# Patient Record
Sex: Male | Born: 1960 | Hispanic: No | Marital: Married | Smoking: Former smoker
Health system: Southern US, Community
[De-identification: ages and names within clinical notes are randomized; demographics above are authoritative.]

## PROBLEM LIST (undated history)

## (undated) DIAGNOSIS — I10 Essential (primary) hypertension: Secondary | ICD-10-CM

## (undated) DIAGNOSIS — I639 Cerebral infarction, unspecified: Secondary | ICD-10-CM

## (undated) DIAGNOSIS — G510 Bell's palsy: Secondary | ICD-10-CM

## (undated) DIAGNOSIS — E785 Hyperlipidemia, unspecified: Secondary | ICD-10-CM

## (undated) HISTORY — DX: Cerebral infarction, unspecified: I63.9

## (undated) HISTORY — DX: Essential (primary) hypertension: I10

## (undated) HISTORY — DX: Bell's palsy: G51.0

## (undated) HISTORY — DX: Hyperlipidemia, unspecified: E78.5

---

## 2006-04-23 ENCOUNTER — Ambulatory Visit: Payer: Self-pay | Admitting: Otolaryngology

## 2008-12-01 ENCOUNTER — Ambulatory Visit: Payer: Self-pay | Admitting: Internal Medicine

## 2013-01-07 ENCOUNTER — Ambulatory Visit: Payer: Self-pay | Admitting: Family Medicine

## 2016-06-27 ENCOUNTER — Other Ambulatory Visit: Payer: Self-pay | Admitting: Family Medicine

## 2016-06-27 ENCOUNTER — Ambulatory Visit
Admission: RE | Admit: 2016-06-27 | Discharge: 2016-06-27 | Disposition: A | Discharge status: Home / Self Care | Payer: BLUE CROSS/BLUE SHIELD | Source: Ambulatory Visit | Attending: Family Medicine | Admitting: Family Medicine

## 2016-06-27 DIAGNOSIS — I700 Atherosclerosis of aorta: Secondary | ICD-10-CM | POA: Diagnosis not present

## 2016-06-27 DIAGNOSIS — R0602 Shortness of breath: Secondary | ICD-10-CM | POA: Insufficient documentation

## 2016-06-27 DIAGNOSIS — R918 Other nonspecific abnormal finding of lung field: Secondary | ICD-10-CM | POA: Diagnosis not present

## 2016-06-27 DIAGNOSIS — R062 Wheezing: Secondary | ICD-10-CM | POA: Diagnosis present

## 2016-06-27 DIAGNOSIS — I708 Atherosclerosis of other arteries: Secondary | ICD-10-CM | POA: Insufficient documentation

## 2016-06-27 DIAGNOSIS — M545 Low back pain: Secondary | ICD-10-CM | POA: Insufficient documentation

## 2016-06-27 DIAGNOSIS — M5136 Other intervertebral disc degeneration, lumbar region: Secondary | ICD-10-CM | POA: Insufficient documentation

## 2016-06-27 DIAGNOSIS — M4856XA Collapsed vertebra, not elsewhere classified, lumbar region, initial encounter for fracture: Secondary | ICD-10-CM | POA: Insufficient documentation

## 2016-09-30 DIAGNOSIS — I639 Cerebral infarction, unspecified: Secondary | ICD-10-CM

## 2016-09-30 HISTORY — DX: Cerebral infarction, unspecified: I63.9

## 2017-07-20 IMAGING — CR DG LUMBAR SPINE COMPLETE 4+V
1 series · 5 of 5 positions shown · non-contrast
Comparison: 01/07/2013

CLINICAL DATA: Pain.

EXAM:
LUMBAR SPINE - COMPLETE 4+ VIEW

[Series 1: dg lumbar spine complete 4 +v · 0.14mm/px · 5 of 5 slices shown]
[im 1/5]
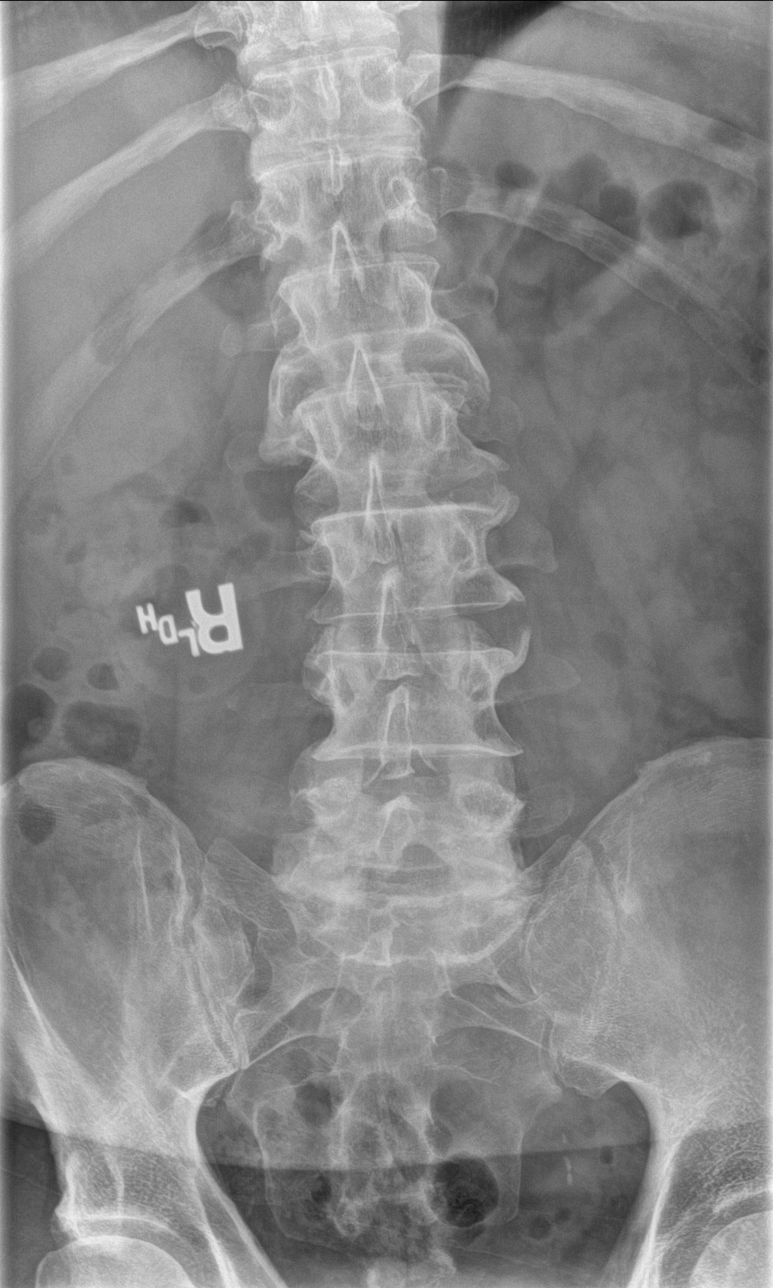
[im 2/5]
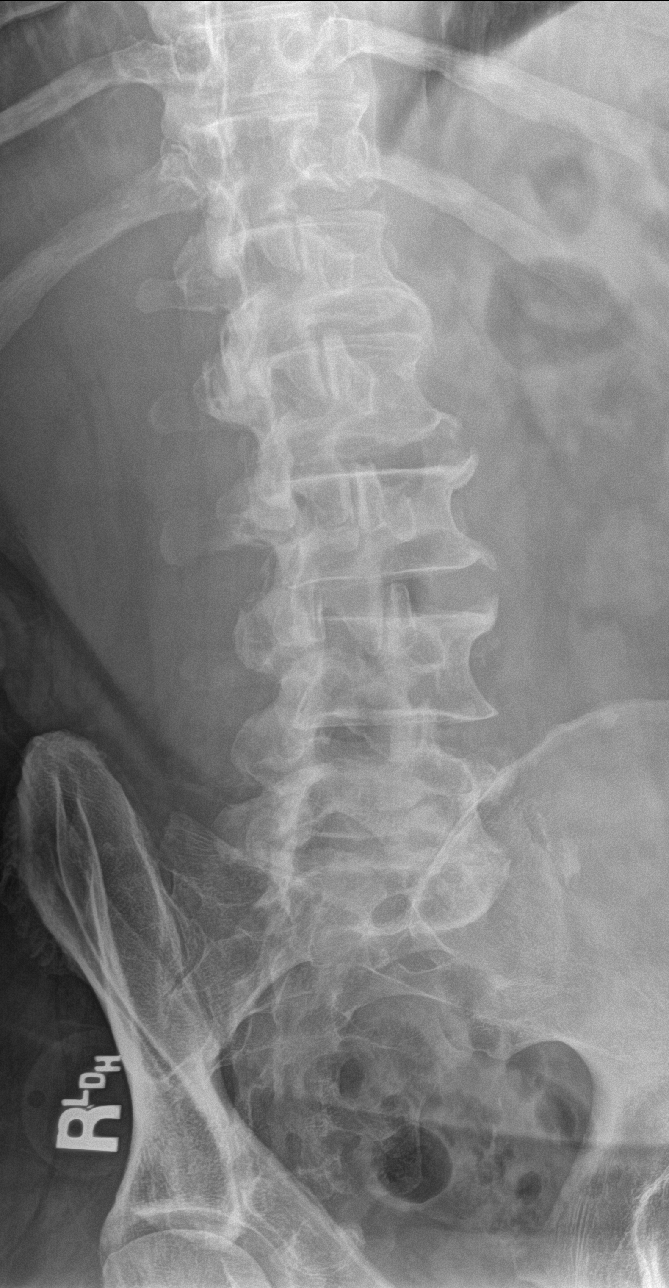
[im 3/5]
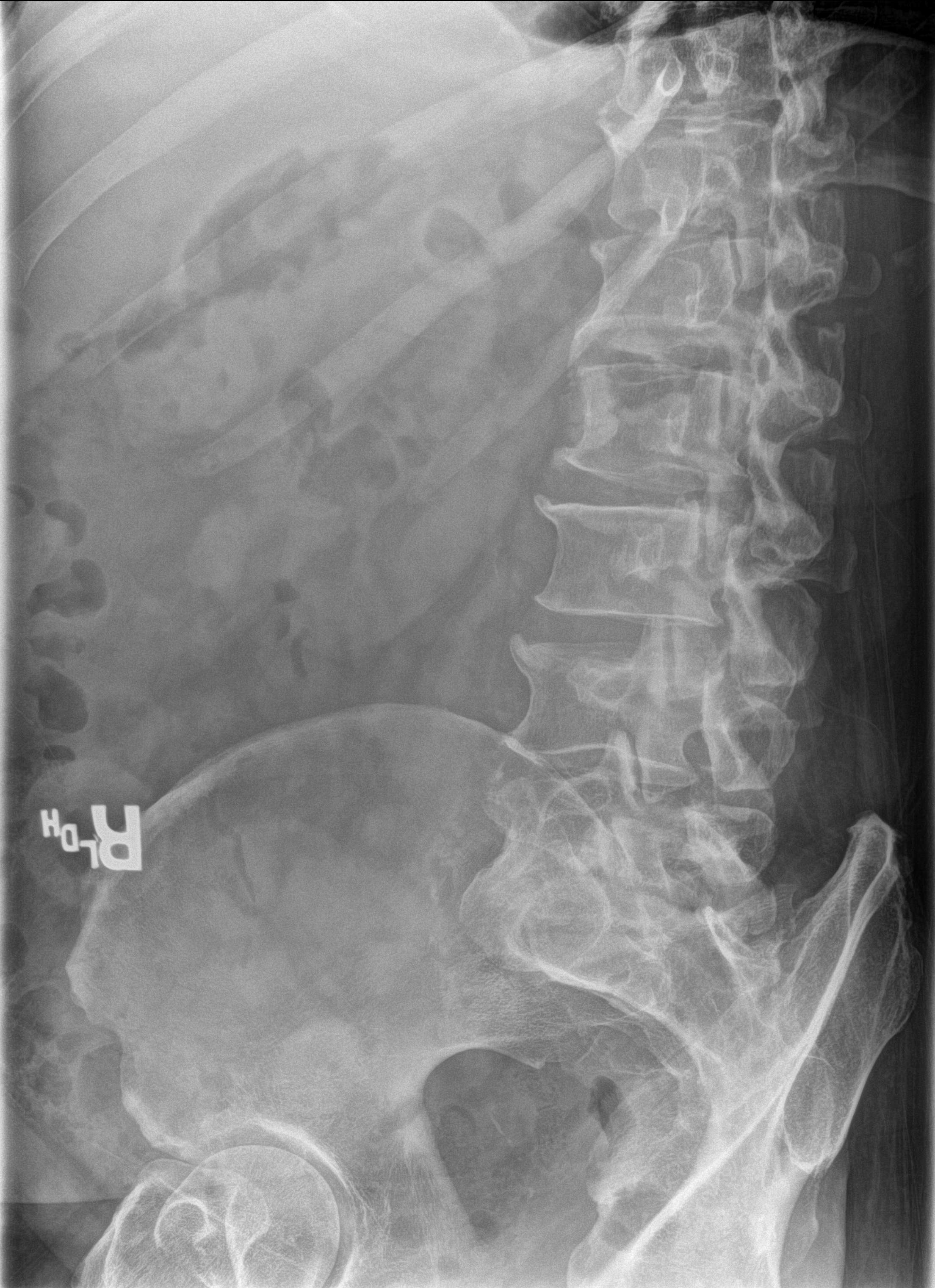
[im 4/5]
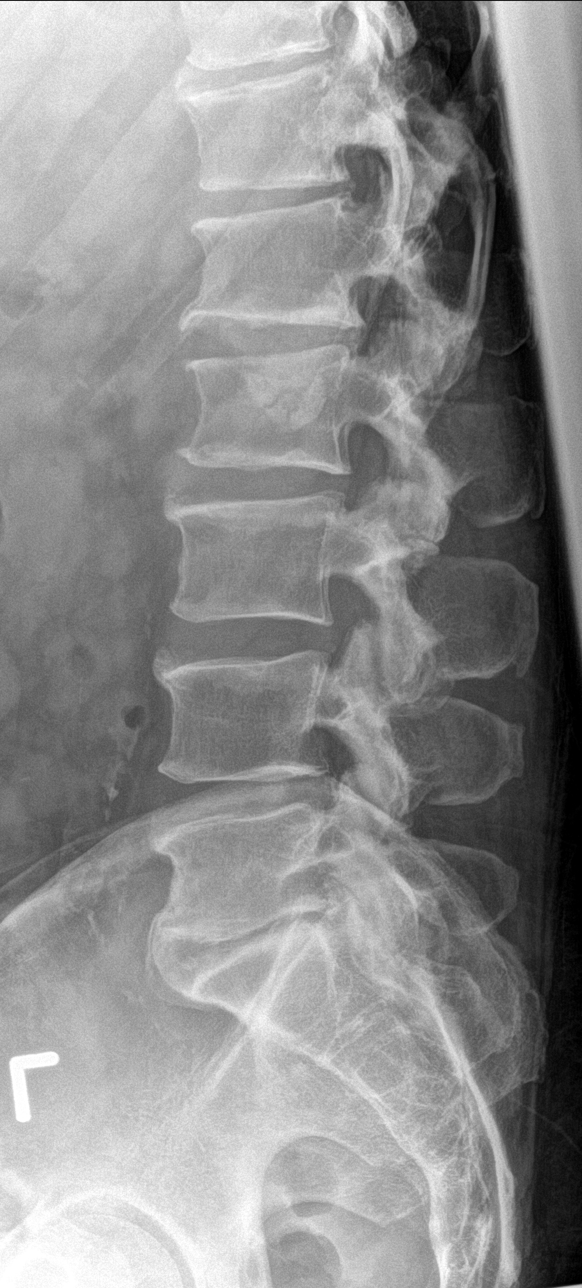
[im 5/5]
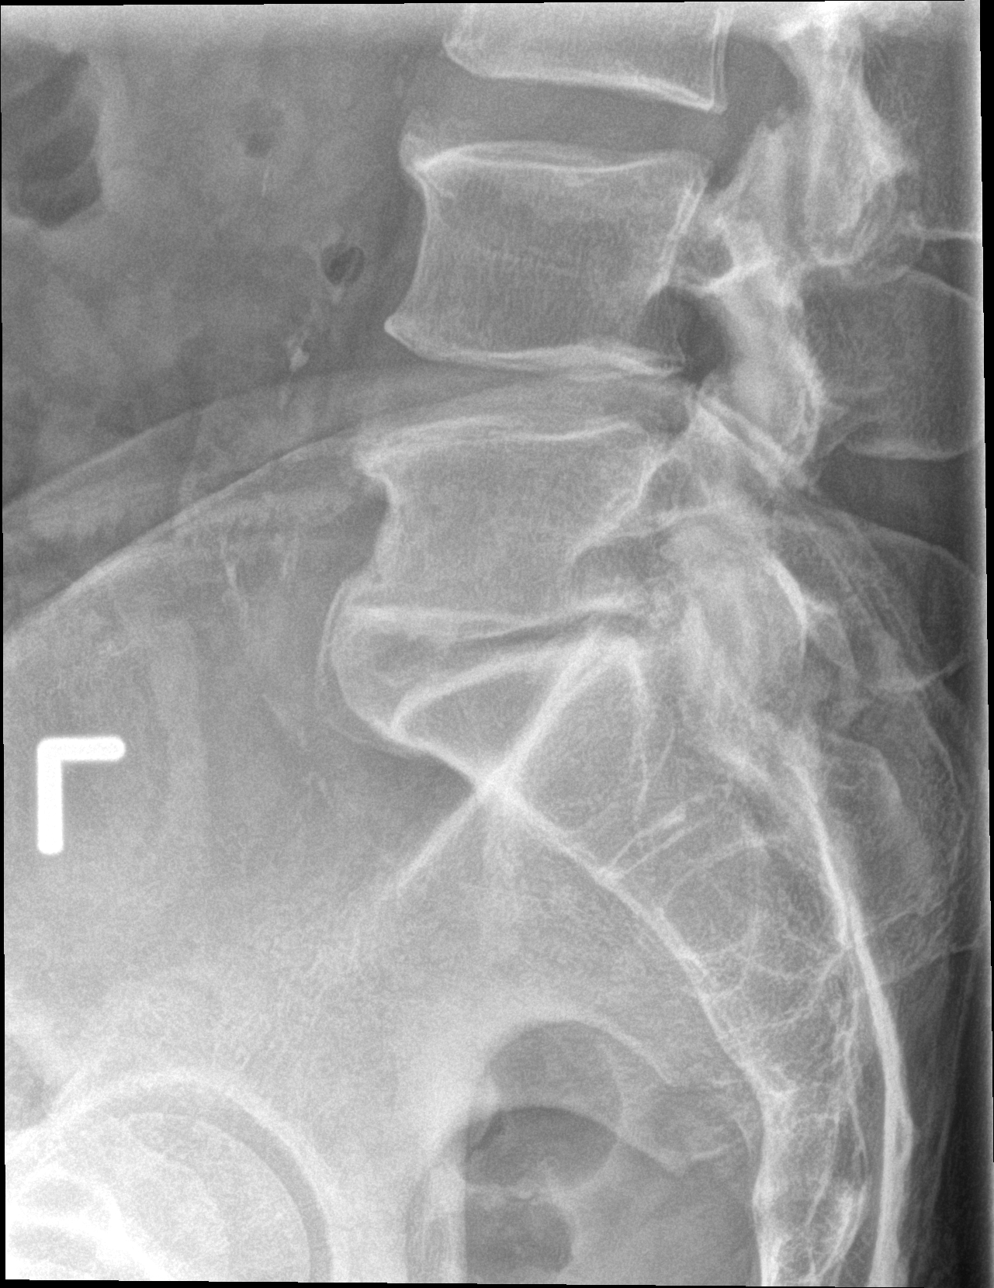

[5 of 5 positions shown; findings below may reference images not displayed]

FINDINGS: Diffuse multilevel degenerative changes lumbar spinal scoliosis
concave right. Stable mild L1 compression fracture. Aortoiliac
atherosclerotic vascular calcification.
IMPRESSION: 1. Diffuse multilevel degenerative change.

2. Stable L1 compression fracture.

3. Aortoiliac atherosclerotic vascular disease .

## 2017-09-24 DIAGNOSIS — I639 Cerebral infarction, unspecified: Secondary | ICD-10-CM | POA: Insufficient documentation

## 2017-09-24 DIAGNOSIS — F17200 Nicotine dependence, unspecified, uncomplicated: Secondary | ICD-10-CM | POA: Insufficient documentation

## 2017-10-06 ENCOUNTER — Encounter: Payer: Self-pay | Admitting: Physical Therapy

## 2017-10-06 ENCOUNTER — Ambulatory Visit: Payer: BLUE CROSS/BLUE SHIELD | Attending: Family Medicine | Admitting: Occupational Therapy

## 2017-10-06 ENCOUNTER — Ambulatory Visit: Payer: BLUE CROSS/BLUE SHIELD | Admitting: Physical Therapy

## 2017-10-06 ENCOUNTER — Encounter: Payer: Self-pay | Admitting: Occupational Therapy

## 2017-10-06 DIAGNOSIS — M6281 Muscle weakness (generalized): Secondary | ICD-10-CM | POA: Diagnosis present

## 2017-10-06 DIAGNOSIS — R278 Other lack of coordination: Secondary | ICD-10-CM | POA: Insufficient documentation

## 2017-10-06 NOTE — Therapy (Signed)
[place] [facility] [address] [place], [place], [address] Phone: [phone]   Fax:  [phone]  Physical Therapy Evaluation  Patient Details  Name: Thomas RathkeMichael E Brandt MRN: [id number] Date of Birth: September 19, 1961 Referring Provider: Luvenia HellerWANG, Booker   Encounter Date: 10/06/2017  PT End of Session - 10/06/17 1434    Visit Number  1    PT Start Time  0150    PT Stop Time  0220    PT Time Calculation (min)  30 min    Equipment Utilized During Treatment  Gait belt    Activity Tolerance  Patient tolerated treatment well    Behavior During Therapy  [facility] for tasks assessed/performed       History reviewed. No pertinent past medical history.  History reviewed. No pertinent surgical history.  There were no vitals filed for this visit.   Subjective Assessment - 10/06/17 1352    Subjective  Patient says that his left side is weaker than his right side but he also says that he has been this way for his whole life.     Pertinent History  Patient was dizzy and was driving home . He was dizzy for 10 hours and he [facility] with slurred speech. He was at the hospital for 2 days. He remembers having a PT very briefly and then he was sent home the next day.     How long can you stand comfortably?  WNL    How long can you walk comfortably?  normal distances    Patient Stated Goals  Not need therapy for outpatient.     Currently in Pain?  No/denies    Multiple Pain Sites  No         [facility] PT Assessment - 10/06/17 1356      Assessment   Medical Diagnosis  cva    Referring Provider  Luvenia HellerWANG, Williams    Onset Date/Surgical Date  09/23/17    Hand Dominance  Right    Next MD Visit  10/17/17    Prior Therapy  hospital      Precautions   Precautions  None      Restrictions   Weight Bearing Restrictions  No      Balance Screen   Has the patient fallen in the past 6 months  No    Has the patient had a decrease in activity level  because of a fear of falling?   No    Is the patient reluctant to leave their home because of a fear of falling?   No      Home Public house managernvironment   Living Environment  Private residence    Living Arrangements  Spouse/significant other    Available Help at Discharge  Family    Type of Home  House    Home Access  Level entry    Home Layout  One level    Home Equipment  None      Prior Function   Level of Independence  Independent    Vocation  Full time employment    Education administratorVocation Requirements  truck mechanic    Leisure  horses      Cognition   Overall Cognitive Status  Within Functional Limits for tasks assessed    Attention  Focused       PAIN: No pain  POSTURE: WNL   PROM/AROM: WNL BUE and BLE   STRENGTH:  Graded on a 0-5 scale Muscle Group Left  Right                          Hip Flex 5/5 5/5  Hip Abd 5/5 5/5  Hip Add 5/5 5/5  Hip Ext 4/5 4/5  Hip IR/ER    Knee Flex 5/5 5/5  Knee Ext 5/5 5/5  Ankle DF 5/5 5/5  Ankle PF 5/5 5/5   SENSATION: WNL all ext's  FUNCTIONAL MOBILITY: independent transfers and bed mobility   BALANCE: Normal dynamic and static standing and independent with tandem stand and single leg stand bilaterally   GAIT: WNL no impairments; acending/descending steps without railings independently, gait speed WNL            Objective measurements completed on examination: See above findings.              PT Education - 10/06/17 1359    Education provided  Yes    Education Details  plan  of care    Person(s) Educated  Patient    Methods  Explanation    Comprehension  Verbalized understanding                  Plan - 10/06/17 1435    Clinical Impression Statement  Patient is 57 yr old male who presents s/p CVA. Patient was in the [facility] hospital for 2 days and discharged home. He has no impairments with strength, ROM, gait or mobility. His balance is WNL sitting and standing. He was not recommended for skilled PT at this  time and no further PT recommended at this time.     Clinical Presentation  Stable    Clinical Decision Making  Low    PT Frequency  One time visit    PT Home Exercise Plan  recommended cardiac work out each day such as walking or gym TM or machine    Consulted and Agree with Plan of Care  Patient       Patient will benefit from skilled therapeutic intervention in order to improve the following deficits and impairments:     Visit Diagnosis: Muscle weakness (generalized)     Problem List There are no active problems to display for this patient.   [address] [place] DPT 10/06/2017, 2:39 PM  [facility] MAIN [facility] SERVICES [address] [place], [place], [address] Phone: [phone]   Fax:  [phone]  Name: Thomas Brandt MRN: [id number] Date of Birth: [birth year]

## 2017-10-07 ENCOUNTER — Encounter: Payer: Self-pay | Admitting: Occupational Therapy

## 2017-10-07 NOTE — Therapy (Signed)
[place] [facility] MAIN [facility] SERVICES [address] [place], [place], [address] Phone: [phone]   Fax:  [phone]  Occupational Therapy Evaluation  Patient Details  Name: Thomas Brandt MRN: [id number] Date of Birth: Jul 22, 1961 No Data Recorded  Encounter Date: 10/06/2017  OT End of Session - 10/07/17 1526    Visit Number  1    Number of Visits  1    OT Start Time  1305    OT Stop Time  1342    OT Time Calculation (min)  37 min    Activity Tolerance  Patient tolerated treatment well    Behavior During Therapy  [facility] for tasks assessed/performed       History reviewed. No pertinent past medical history.  History reviewed. No pertinent surgical history.  There were no vitals filed for this visit.  Subjective Assessment - 10/06/17 1312    Subjective   Patient reports his doctor at [facility] said he could return to work however MD at his work wanted him to get evaluated first by therapy. Patient reports he is doing well and has had no difficulty with his daily tasks, independent with self care and IADL tasks including driving.     Pertinent History  Patient reports he had a CVA on Christmas day, 09/23/17 and was hospitalized at [facility] for 2 days.  Initial symptoms he was dizzy and had left sided weakness which he reports resolved.     Patient Stated Goals  Patient reports he wants to be independent in all tasks, return to work.     Currently in Pain?  No/denies    Multiple Pain Sites  No        [facility] OT Assessment - 10/07/17 1533      Assessment   Medical Diagnosis  CVA    Referring Provider  Luvenia Heller    Onset Date/Surgical Date  09/23/17    Hand Dominance  Right    Next MD Visit  10/17/2017    Prior Therapy  hospital      Precautions   Precautions  None      Restrictions   Weight Bearing Restrictions  No      Balance Screen   Has the patient fallen in the past 6 months  No      Home  Environment   Family/patient expects  to be discharged to:  Private residence    Living Arrangements  Spouse/significant other    Available Help at Discharge  Family    Type of Home  House    Home Access  Stairs    Home Layout  One level    Bathroom Shower/Tub  Tub/Shower unit;Curtain    Games developer  None    Lives With  Spouse      Prior Function   Level of Independence  Independent    Vocation  Full time employment    Education administrator    Leisure  horses      ADL   Eating/Feeding  Independent    Grooming  Independent    Upper Body Bathing  Independent    Lower Body Bathing  Independent    Financial trader - Air traffic controller  Independent      IADL   Shopping  Takes care of all shopping needs independently    Light Housekeeping  Does personal laundry completely    Meal Prep  Plans, prepares and serves adequate meals independently    Prior Level of Function Medication Managment  independent    Prior Level of Function Financial Management  independent      Mobility   Mobility Status  Independent      Written Expression   Dominant Hand  Right      Vision - History   Baseline Vision  No visual deficits      Cognition   Overall Cognitive Status  Within Functional Limits for tasks assessed      Sensation   Light Touch  Appears Intact    Stereognosis  Appears Intact    Hot/Cold  Appears Intact    Proprioception  Appears Intact      Coordination   Gross Motor Movements are Fluid and Coordinated  Yes    Fine Motor Movements are Fluid and Coordinated  Yes    Coordination and Movement Description  14 sec difference in 9 hole peg test but functional no no issues with finger to nose or rapid alternating movements.     9 Hole Peg Test  Right;Left    Right 9 Hole Peg Test  24    Left 9 Hole Peg Test  38      ROM / Strength    AROM / PROM / Strength  AROM;Strength      AROM   Overall AROM   Within functional limits for tasks performed      Strength   Overall Strength  Within functional limits for tasks performed    Overall Strength Comments  5/5 BUE      Hand Function   Right Hand Grip (lbs)  95    Right Hand Lateral Pinch  29 lbs    Right Hand 3 Point Pinch  24 lbs    Left Hand Grip (lbs)  90    Left Hand Lateral Pinch  24 lbs    Left 3 point pinch  17 lbs                      OT Education - 10/07/17 1525    Education provided  Yes    Education Details  OT role, plan of care, coordination exercises for LUE.     Person(s) Educated  Patient    Methods  Explanation;Demonstration;Handout    Comprehension  Returned demonstration;Verbalized understanding;Need further instruction          OT Long Term Goals - 10/07/17 1531      OT LONG TERM GOAL #1   Title  Patient will demonstrate understanding of coordination exercises for left hand for increased speed and dexterity.      Baseline  able to demo understanding at eval    Time  1    Period  Days    Status  Achieved            Plan - 10/07/17 1526    Clinical Impression Statement  Patient is a 57 yo male who suffered a CVA on 09/23/2017 and [facility]  He was discharged home and reports his doctor said he could return to work without any restrictions.  He was referred for further OT/PT evaluation for return to work.  Patient was evaluated by OT this date and was able to complete all tasks independently including self care,  IADL tasks and reports he is able to drive. His strength was 5/5 in BUE, grip strength was Right 95#, left 90#.  He had good pinch strengths and coordination was fair to good.  Issued written home program to further work on left coordination skills for speed and dexterity however patient was functional with all tasks, 14 sec difference in right hand left hand speed with 9 hole  peg test. Patient denies any pain.  He has no current OT needs at this time and no further OT is recommended.     OT Frequency  One time visit    OT Treatment/Interventions  Neuromuscular education    Clinical Decision Making  Limited treatment options, no task modification necessary    Consulted and Agree with Plan of Care  Patient       Patient will benefit from skilled therapeutic intervention in order to improve the following deficits and impairments:     Visit Diagnosis: Other lack of coordination    Problem List There are no active problems to display for this patient.  Kerrie Buffalo, OTR/L, CLT  Lovett,Amy 10/07/2017, 3:37 PM  [place] [facility] MAIN [facility] SERVICES [address] [place], [place], [address] Phone: [phone]   Fax:  [phone]  Name: Thomas Brandt MRN: [id number] Date of Birth: 11-Nov-1960

## 2017-10-13 ENCOUNTER — Ambulatory Visit: Payer: BLUE CROSS/BLUE SHIELD | Admitting: Physical Therapy

## 2017-10-13 ENCOUNTER — Encounter: Payer: BLUE CROSS/BLUE SHIELD | Admitting: Occupational Therapy

## 2017-10-15 ENCOUNTER — Encounter: Payer: BLUE CROSS/BLUE SHIELD | Admitting: Occupational Therapy

## 2017-10-15 ENCOUNTER — Ambulatory Visit: Payer: BLUE CROSS/BLUE SHIELD | Admitting: Physical Therapy

## 2017-10-21 ENCOUNTER — Encounter: Payer: BLUE CROSS/BLUE SHIELD | Admitting: Occupational Therapy

## 2017-10-21 ENCOUNTER — Ambulatory Visit: Payer: BLUE CROSS/BLUE SHIELD | Admitting: Physical Therapy

## 2017-10-23 ENCOUNTER — Encounter: Payer: BLUE CROSS/BLUE SHIELD | Admitting: Occupational Therapy

## 2017-10-23 ENCOUNTER — Ambulatory Visit: Payer: BLUE CROSS/BLUE SHIELD | Admitting: Physical Therapy

## 2017-10-27 ENCOUNTER — Encounter: Payer: BLUE CROSS/BLUE SHIELD | Admitting: Occupational Therapy

## 2017-10-27 ENCOUNTER — Ambulatory Visit: Payer: BLUE CROSS/BLUE SHIELD | Admitting: Physical Therapy

## 2017-10-29 ENCOUNTER — Encounter: Payer: BLUE CROSS/BLUE SHIELD | Admitting: Occupational Therapy

## 2017-10-29 ENCOUNTER — Ambulatory Visit: Payer: BLUE CROSS/BLUE SHIELD | Admitting: Physical Therapy

## 2018-11-02 ENCOUNTER — Encounter: Payer: Self-pay | Admitting: Family Medicine

## 2018-11-02 ENCOUNTER — Ambulatory Visit (INDEPENDENT_AMBULATORY_CARE_PROVIDER_SITE_OTHER): Payer: 59 | Admitting: Family Medicine

## 2018-11-02 VITALS — BP 126/68 | HR 89 | Temp 98.4°F | Ht 72.0 in | Wt 222.1 lb

## 2018-11-02 DIAGNOSIS — E119 Type 2 diabetes mellitus without complications: Secondary | ICD-10-CM

## 2018-11-02 DIAGNOSIS — E782 Mixed hyperlipidemia: Secondary | ICD-10-CM

## 2018-11-02 DIAGNOSIS — Z8673 Personal history of transient ischemic attack (TIA), and cerebral infarction without residual deficits: Secondary | ICD-10-CM

## 2018-11-02 DIAGNOSIS — J441 Chronic obstructive pulmonary disease with (acute) exacerbation: Secondary | ICD-10-CM

## 2018-11-02 DIAGNOSIS — I10 Essential (primary) hypertension: Secondary | ICD-10-CM | POA: Diagnosis not present

## 2018-11-02 DIAGNOSIS — Z1211 Encounter for screening for malignant neoplasm of colon: Secondary | ICD-10-CM

## 2018-11-02 MED ORDER — METHYLPREDNISOLONE 4 MG PO TBPK: ORAL_TABLET | ORAL | 0 refills | Status: DC

## 2018-11-02 MED ORDER — AMLODIPINE BESY-BENAZEPRIL HCL 5-20 MG PO CAPS: 1.0000 | ORAL_CAPSULE | Freq: Every day | ORAL | 3 refills | Status: DC

## 2018-11-02 MED ORDER — AZITHROMYCIN 250 MG PO TABS: ORAL_TABLET | ORAL | 0 refills | Status: DC

## 2018-11-02 NOTE — Progress Notes (Signed)
Subjective:    Patient ID: Thomas Brandt, male    DOB: 05-07-1961, 58 y.o.   MRN: [id number]  HPI   Patient presents to clinic to establish with PCP.  Patient has type 2 diabetes, does not check his blood sugar every day.  Currently takes metformin XR 500 mg.  Patient has COPD.  Breathing is controlled with Incruse and Symbicort.  Also takes Singulair and Zyrtec during allergy seasons to help reduce any exacerbations.  Currently patient is having chest congestion, cough and wheezing.  States sometimes he will cough up thick tan phlegm.  Patient has a history of high blood pressure and elevated cholesterol.  BP well controlled on current medications.  Tolerating atorvastatin without any issues, denies any muscle aches or abdominal pains.  Patient does have a history of CVA in 2018.  Patient was followed by neurology and cardiology for a year, but was told he is stable and did not need to follow with them regularly anymore.  Patient Active Problem List   Diagnosis Date Noted  . Essential hypertension 11/03/2018  . Type 2 diabetes mellitus without complication, without long-term current use of insulin (HCC) 11/03/2018  . Chronic obstructive pulmonary disease (HCC) 11/03/2018  . Mixed hyperlipidemia 11/03/2018   Past Medical History:  Diagnosis Date  . Hyperlipidemia   . Hypertension   . Stroke [facility])    Social History   Tobacco Use  . Smoking status: Former Smoker    Types: Cigarettes  . Smokeless tobacco: Never Used  Substance Use Topics  . Alcohol use: Not on file   Family History  Problem Relation Age of Onset  . Heart disease Father    History reviewed. No pertinent surgical history.  Review of Systems  Constitutional: Negative for chills, fatigue and fever.  HENT: Negative for congestion, ear pain, sinus pain and sore throat.   Eyes: Negative.   Respiratory: cough, chest congestion, shortness of breath and wheezing.   Cardiovascular: Negative for chest pain,  palpitations and leg swelling.  Gastrointestinal: Negative for abdominal pain, diarrhea, nausea and vomiting.  Genitourinary: Negative for dysuria, frequency and urgency.  Musculoskeletal: Negative for arthralgias and myalgias.  Skin: Negative for color change, pallor and rash.  Neurological: Negative for syncope, light-headedness and headaches.  Psychiatric/Behavioral: The patient is not nervous/anxious.       Objective:   Physical Exam Vitals signs and nursing note reviewed.  Constitutional:      General: He is not in acute distress.    Appearance: He is not toxic-appearing.  HENT:     Head: Normocephalic and atraumatic.     Right Ear: Tympanic membrane and ear canal normal.     Left Ear: Tympanic membrane and ear canal normal.     Nose: Nose normal.     Mouth/Throat:     Mouth: Mucous membranes are moist.  Eyes:     Pupils: Pupils are equal, round, and reactive to light.  Neck:     Musculoskeletal: Neck supple. No neck rigidity.     Vascular: No carotid bruit.  Cardiovascular:     Rate and Rhythm: Normal rate and regular rhythm.     Heart sounds: Normal heart sounds.  Pulmonary:     Effort: Pulmonary effort is normal. No respiratory distress.     Breath sounds: Wheezing and rhonchi present. No rales.     Comments: Wheezes and throughout. Scattered rhonchi.  Abdominal:     Palpations: Abdomen is soft.  Musculoskeletal:     Right  lower leg: No edema.     Left lower leg: No edema.  Lymphadenopathy:     Cervical: No cervical adenopathy.  Skin:    General: Skin is warm and dry.     Coloration: Skin is not pale.  Neurological:     Mental Status: He is alert and oriented to person, place, and time.     Gait: Gait normal.  Psychiatric:        Mood and Affect: Mood normal.        Behavior: Behavior normal.    Vitals:   11/02/18 1116  BP: 126/68  Pulse: 89  Temp: 98.4 F (36.9 C)  SpO2: 95%      Assessment & Plan:   Essential hypertension-blood pressure well  controlled on current medications.  Refill of amlodipine benazepril given.  Mixed hyperlipidemia-patient tolerating current atorvastatin dose without any issues.  We will continue this medication.  History of CVA- patient will continue Plavix for blood thinning purposes as recommended by cardiology and neurology.  COPD with exacerbation-patient will continue his Incruse and Symbicort as he currently takes.  We will treat with course of steroid, Z-Pak to treat exacerbation.  Colon cancer screening-patient would prefer not to have a colonoscopy, but is agreeable to Cologuard.  Cologuard order placed.  Lab work drawn in clinic today.  Patient will follow-up in approximately 3 months for complete physical exam.

## 2018-11-03 ENCOUNTER — Other Ambulatory Visit: Payer: Self-pay | Admitting: Family Medicine

## 2018-11-03 ENCOUNTER — Encounter: Payer: Self-pay | Admitting: Family Medicine

## 2018-11-03 DIAGNOSIS — J449 Chronic obstructive pulmonary disease, unspecified: Secondary | ICD-10-CM | POA: Insufficient documentation

## 2018-11-03 DIAGNOSIS — Z8673 Personal history of transient ischemic attack (TIA), and cerebral infarction without residual deficits: Secondary | ICD-10-CM | POA: Insufficient documentation

## 2018-11-03 DIAGNOSIS — E782 Mixed hyperlipidemia: Secondary | ICD-10-CM | POA: Insufficient documentation

## 2018-11-03 DIAGNOSIS — I10 Essential (primary) hypertension: Secondary | ICD-10-CM | POA: Insufficient documentation

## 2018-11-03 DIAGNOSIS — E119 Type 2 diabetes mellitus without complications: Secondary | ICD-10-CM | POA: Insufficient documentation

## 2018-11-03 NOTE — Telephone Encounter (Signed)
Copied from CRM [id number]. Topic: Quick Communication - Rx Refill/Question >> Nov 03, 2018 11:54 AM Percival Spanish wrote: Medication   amLODipine-benazepril (LOTREL) 5-20 MG capsule     rx was sent 11/02/2018 and phamracy told pt they did not received the req   Has the patient contacted their pharmacy yes   (Agent: If no, request that the patient contact the pharmacy for the refill.) (Agent: If yes, when and what did the pharmacy advise?)  Preferred Pharmacy  [facility]   Agent: Please be advised that RX refills may take up to 3 business days. We ask that you follow-up with your pharmacy.

## 2018-11-16 ENCOUNTER — Other Ambulatory Visit (INDEPENDENT_AMBULATORY_CARE_PROVIDER_SITE_OTHER): Payer: 59

## 2018-11-16 DIAGNOSIS — E119 Type 2 diabetes mellitus without complications: Secondary | ICD-10-CM | POA: Diagnosis not present

## 2018-11-16 DIAGNOSIS — E538 Deficiency of other specified B group vitamins: Secondary | ICD-10-CM

## 2018-11-16 DIAGNOSIS — I10 Essential (primary) hypertension: Secondary | ICD-10-CM

## 2018-11-16 LAB — CBC
HCT: 44 % (ref 39.0–52.0)
Hemoglobin: 14.9 g/dL (ref 13.0–17.0)
MCHC: 33.9 g/dL (ref 30.0–36.0)
MCV: 89.9 fl (ref 78.0–100.0)
Platelets: 286 10*3/uL (ref 150.0–400.0)
RBC: 4.9 Mil/uL (ref 4.22–5.81)
RDW: 14.1 % (ref 11.5–15.5)
WBC: 10.8 10*3/uL — ABNORMAL HIGH (ref 4.0–10.5)

## 2018-11-16 LAB — COMPREHENSIVE METABOLIC PANEL
ALT: 32 U/L (ref 0–53)
AST: 18 U/L (ref 0–37)
Albumin: 4.1 g/dL (ref 3.5–5.2)
Alkaline Phosphatase: 86 U/L (ref 39–117)
BUN: 11 mg/dL (ref 6–23)
CO2: 30 mEq/L (ref 19–32)
Calcium: 9.2 mg/dL (ref 8.4–10.5)
Chloride: 101 mEq/L (ref 96–112)
Creatinine, Ser: 0.7 mg/dL (ref 0.40–1.50)
GFR: 116.16 mL/min (ref >60.00)
Glucose, Bld: 133 mg/dL — ABNORMAL HIGH (ref 70–99)
Potassium: 4.5 mEq/L (ref 3.5–5.1)
Sodium: 140 mEq/L (ref 135–145)
Total Bilirubin: 0.8 mg/dL (ref 0.2–1.2)
Total Protein: 7.1 g/dL (ref 6.0–8.3)

## 2018-11-16 LAB — LIPID PANEL
Cholesterol: 126 mg/dL (ref 0–200)
HDL: 46.8 mg/dL (ref >39.00)
LDL Cholesterol: 48 mg/dL (ref 0–99)
NonHDL: 78.9
Total CHOL/HDL Ratio: 3
Triglycerides: 154 mg/dL — ABNORMAL HIGH (ref 0.0–149.0)
VLDL: 30.8 mg/dL (ref 0.0–40.0)

## 2018-11-16 LAB — HEMOGLOBIN A1C: Hgb A1c MFr Bld: 7.2 % — ABNORMAL HIGH (ref 4.6–6.5)

## 2018-11-17 LAB — THYROID PANEL WITH TSH
Free Thyroxine Index: 2.1 (ref 1.4–3.8)
T3 Uptake: 29 % (ref 22–35)
T4, Total: 7.1 ug/dL (ref 4.9–10.5)
TSH: 1.68 mIU/L (ref 0.40–4.50)

## 2018-11-17 LAB — VITAMIN D 25 HYDROXY (VIT D DEFICIENCY, FRACTURES): VITD: 38.18 ng/mL (ref 30.00–100.00)

## 2018-11-17 LAB — B12 AND FOLATE PANEL
Folate: 12.8 ng/mL (ref >5.9)
Vitamin B-12: 163 pg/mL — ABNORMAL LOW (ref 211–911)

## 2018-11-18 MED ORDER — METFORMIN HCL ER (MOD) 1000 MG PO TB24: 1000.0000 mg | ORAL_TABLET | Freq: Every day | ORAL | 1 refills | Status: DC

## 2018-11-18 MED ORDER — CYANOCOBALAMIN 500 MCG PO TABS: 500.0000 ug | ORAL_TABLET | Freq: Every day | ORAL | 0 refills | Status: DC

## 2018-11-18 NOTE — Addendum Note (Signed)
Addended by: Leanora Cover on: 11/18/2018 02:34 PM   Modules accepted: Orders

## 2018-11-18 NOTE — Addendum Note (Signed)
Addended by: Leanora Cover on: 11/18/2018 03:44 PM   Modules accepted: Orders

## 2018-11-25 ENCOUNTER — Telehealth: Payer: Self-pay | Admitting: Lab

## 2018-11-25 ENCOUNTER — Other Ambulatory Visit: Payer: Self-pay | Admitting: Family Medicine

## 2018-11-25 DIAGNOSIS — E119 Type 2 diabetes mellitus without complications: Secondary | ICD-10-CM

## 2018-11-25 MED ORDER — METFORMIN HCL ER 500 MG PO TB24: 1000.0000 mg | ORAL_TABLET | Freq: Every day | ORAL | 1 refills | Status: DC

## 2018-11-25 NOTE — Telephone Encounter (Signed)
Called Pt about Rx increase, finally was able to reach Pt, and he stated pharmacy would not fill the Rx.   I called pharmacy and found out that the Metformin Glumetza 1000MG   Was very expensive $1000.00 for 30days and insurance would not cover.  The pharmacist that you should order the generic Metformin Glucophage which only comes in 500MG  and the Pt would have to take 2.

## 2018-11-25 NOTE — Telephone Encounter (Signed)
Different Rx sent in

## 2018-11-25 NOTE — Telephone Encounter (Signed)
Called Pt to tell him the Metformin Glucophage Rx was sent In to the pharmacy

## 2019-01-11 ENCOUNTER — Other Ambulatory Visit: Payer: Self-pay | Admitting: Family Medicine

## 2019-01-11 NOTE — Telephone Encounter (Signed)
Requested medication (s) are due for refill today: Yes  Requested medication (s) are on the active medication list: Yes  Last refill:  By historical provider  Future visit scheduled: Yes  Notes to clinic: Unable to refill per protocol, last refilled by another provider     Requested Prescriptions  Pending Prescriptions Disp Refills   umeclidinium bromide (INCRUSE ELLIPTA) 62.5 MCG/INH AEPB      Sig: Inhale 1 puff into the lungs daily.     Off-Protocol Failed - 01/11/2019 12:50 PM      Failed - Medication not assigned to a protocol, review manually.      Passed - Valid encounter within last 12 months    Recent Outpatient Visits          2 months ago Essential hypertension   [facility] Guse, Janna Arch, FNP      Future Appointments            In 3 weeks Guse, Janna Arch, FNP [facility]

## 2019-01-13 MED ORDER — UMECLIDINIUM BROMIDE 62.5 MCG/INH IN AEPB: 1.0000 | INHALATION_SPRAY | Freq: Every day | RESPIRATORY_TRACT | 5 refills | Status: DC

## 2019-01-27 ENCOUNTER — Other Ambulatory Visit: Payer: Self-pay | Admitting: Family Medicine

## 2019-01-27 DIAGNOSIS — E538 Deficiency of other specified B group vitamins: Secondary | ICD-10-CM

## 2019-02-01 ENCOUNTER — Ambulatory Visit: Payer: Self-pay | Admitting: Family Medicine

## 2019-02-08 ENCOUNTER — Ambulatory Visit (INDEPENDENT_AMBULATORY_CARE_PROVIDER_SITE_OTHER): Payer: 59 | Admitting: Family Medicine

## 2019-02-08 ENCOUNTER — Telehealth: Payer: Self-pay | Admitting: Family Medicine

## 2019-02-08 ENCOUNTER — Encounter: Payer: Self-pay | Admitting: Family Medicine

## 2019-02-08 ENCOUNTER — Other Ambulatory Visit: Payer: Self-pay

## 2019-02-08 DIAGNOSIS — I10 Essential (primary) hypertension: Secondary | ICD-10-CM

## 2019-02-08 DIAGNOSIS — E782 Mixed hyperlipidemia: Secondary | ICD-10-CM

## 2019-02-08 DIAGNOSIS — Z8673 Personal history of transient ischemic attack (TIA), and cerebral infarction without residual deficits: Secondary | ICD-10-CM

## 2019-02-08 DIAGNOSIS — J449 Chronic obstructive pulmonary disease, unspecified: Secondary | ICD-10-CM

## 2019-02-08 DIAGNOSIS — J301 Allergic rhinitis due to pollen: Secondary | ICD-10-CM

## 2019-02-08 DIAGNOSIS — E119 Type 2 diabetes mellitus without complications: Secondary | ICD-10-CM | POA: Diagnosis not present

## 2019-02-08 MED ORDER — BUDESONIDE-FORMOTEROL FUMARATE 80-4.5 MCG/ACT IN AERO: 2.0000 | INHALATION_SPRAY | Freq: Two times a day (BID) | RESPIRATORY_TRACT | 5 refills | Status: DC

## 2019-02-08 MED ORDER — LORATADINE 10 MG PO TABS: 10.0000 mg | ORAL_TABLET | Freq: Every day | ORAL | 1 refills | Status: DC

## 2019-02-08 MED ORDER — ATORVASTATIN CALCIUM 80 MG PO TABS: 80.0000 mg | ORAL_TABLET | Freq: Every day | ORAL | 1 refills | Status: DC

## 2019-02-08 MED ORDER — METFORMIN HCL 1000 MG PO TABS: 1000.0000 mg | ORAL_TABLET | Freq: Two times a day (BID) | ORAL | 3 refills | Status: DC

## 2019-02-08 MED ORDER — MONTELUKAST SODIUM 10 MG PO TABS: 10.0000 mg | ORAL_TABLET | Freq: Every day | ORAL | 1 refills | Status: DC

## 2019-02-08 NOTE — Progress Notes (Signed)
Patient ID: Thomas Brandt, male   DOB: 12-06-1960, 58 y.o.   MRN: [id number]    Virtual Visit via Video Note  This visit type was conducted due to national recommendations for restrictions regarding the COVID-19 pandemic (e.g. social distancing).  This format is felt to be most appropriate for this patient at this time.  All issues noted in this document were discussed and addressed.  No physical exam was performed (except for noted visual exam findings with Video Visits).   I connected with Loraine Maple today at  8:00 AM EDT by a video enabled telemedicine application and verified that I am speaking with the correct person using two identifiers. Location patient: home Location provider:[facility] Persons participating in the virtual visit: patient, provider  I discussed the limitations, risks, security and privacy concerns of performing an evaluation and management service by telephone and the availability of in person appointments. I also discussed with the patient that there may be a patient responsible charge related to this service. The patient expressed understanding and agreed to proceed.   HPI:  Patient & I connected via video to follow-up on diabetes, blood pressure, lipids, breathing.  Overall patient states he has been feeling quite well throughout the COVID-19 pandemic.  He has not had any symptoms of illness.  He has had a flareup of his normal seasonal allergies in the springtime.  Has clear runny nose, which is improved with use of Claritin.  States he would like to resume Singulair at bedtime as he has taken previously because the Claritin does not always hold out for the whole day to keep his allergy symptoms under control.  Breathing has been stable.  He feels well on the Symbicort and Incruse.  Has had no issues with breakthrough wheezing, shortness of breath or coughing up thick phlegm.  Been tolerating blood pressure medication without any problems.  No  palpitations, chest pain or lower extremity swelling.  Tolerating atorvastatin without any problems.  No issues with generalized body aches or abdominal pains.  Patient states he has not been regularly checking his blood sugar, discussed by how he feels.  Denies any episodes of hypoglycemia.  Denies increased thirst, increased urination or increased hunger.  Currently he is on thousand milligrams XR metformin daily.  Lab Results  Component Value Date   HGBA1C 7.2 (H) 11/16/2018    ROS:   Constitutional: Negative for chills, fatigue and fever.  HENT: Negative for congestion, ear pain, sinus pain and sore throat.   Eyes: Negative.   Respiratory: Negative for cough, shortness of breath and wheezing.   Cardiovascular: Negative for chest pain, palpitations and leg swelling.  Gastrointestinal: Negative for abdominal pain, diarrhea, nausea and vomiting.  Genitourinary: Negative for dysuria, frequency and urgency.  Musculoskeletal: Negative for arthralgias and myalgias.  Skin: Negative for color change, pallor and rash.  Neurological: Negative for syncope, light-headedness and headaches.  Psychiatric/Behavioral: The patient is not nervous/anxious.     Past Medical History:  Diagnosis Date  . Hyperlipidemia   . Hypertension   . Stroke [facility])    Family History  Problem Relation Age of Onset  . Heart disease Father    Social History   Tobacco Use  . Smoking status: Former Smoker    Types: Cigarettes  . Smokeless tobacco: Never Used  Substance Use Topics  . Alcohol use: Not on file    Current Outpatient Medications:  .  amLODipine-benazepril (LOTREL) 5-20 MG capsule, Take 1 capsule by mouth daily., Disp: 90  capsule, Rfl: 3 .  aspirin 81 MG chewable tablet, Chew by mouth daily., Disp: , Rfl:  .  atorvastatin (LIPITOR) 80 MG tablet, Take 1 tablet (80 mg total) by mouth daily., Disp: 90 tablet, Rfl: 1 .  budesonide-formoterol (SYMBICORT) 80-4.5 MCG/ACT inhaler, Inhale 2 puffs into  the lungs 2 (two) times daily., Disp: 1 Inhaler, Rfl: 5 .  cholecalciferol (VITAMIN D) 400 units TABS tablet, Take 1,000 Units by mouth., Disp: , Rfl:  .  montelukast (SINGULAIR) 10 MG tablet, Take 1 tablet (10 mg total) by mouth at bedtime., Disp: 90 tablet, Rfl: 1 .  umeclidinium bromide (INCRUSE ELLIPTA) 62.5 MCG/INH AEPB, Inhale 1 puff into the lungs daily., Disp: 1 each, Rfl: 5 .  vitamin B-12 (CYANOCOBALAMIN) 500 MCG tablet, TAKE 1 TABLET BY MOUTH EVERY DAY, Disp: 100 tablet, Rfl: 1 .  loratadine (CLARITIN) 10 MG tablet, Take 1 tablet (10 mg total) by mouth daily., Disp: 90 tablet, Rfl: 1 .  metFORMIN (GLUCOPHAGE) 1000 MG tablet, Take 1 tablet (1,000 mg total) by mouth 2 (two) times daily with a meal., Disp: 180 tablet, Rfl: 3  EXAM:  GENERAL: alert, oriented, appears well and in no acute distress  HEENT: atraumatic, conjunttiva clear, no obvious abnormalities on inspection of external nose and ears  NECK: normal movements of the head and neck  LUNGS: on inspection no signs of respiratory distress, breathing rate appears normal, no obvious gross SOB, gasping or wheezing  CV: no obvious cyanosis  MS: moves all visible extremities without noticeable abnormality  PSYCH/NEURO: pleasant and cooperative, no obvious depression or anxiety, speech and thought processing grossly intact  ASSESSMENT AND PLAN:  Discussed the following assessment and plan:  Essential hypertension  Type 2 diabetes mellitus without complication, without long-term current use of insulin (HCC) - Plan: metFORMIN (GLUCOPHAGE) 1000 MG tablet  Chronic obstructive pulmonary disease, unspecified COPD type (HCC) - Plan: montelukast (SINGULAIR) 10 MG tablet, budesonide-formoterol (SYMBICORT) 80-4.5 MCG/ACT inhaler  Mixed hyperlipidemia - Plan: atorvastatin (LIPITOR) 80 MG tablet  History of CVA (cerebrovascular accident) - Plan: atorvastatin (LIPITOR) 80 MG tablet  Seasonal allergic rhinitis due to pollen - Plan:  loratadine (CLARITIN) 10 MG tablet  Patient tolerating the medications have any problems.  We will refill this for him.  Tolerating statin without any issues, refill atorvastatin sent in.  COPD well controlled on Symbicort and Incruse.  We will also add on Singulair to better control seasonal allergy symptoms in conjunction with use of loratadine in a.m.  Due to patient's history of CVA, discussed importance of being sure his blood pressure, sugar and cholesterol are under good control.  We will increase metformin to 1000 mg twice daily due to last A1c being 7.2%.  Discussed healthy diet and regular physical activity, recommended a diet high in lean proteins, lots of vegetables and lower sugars and carbs.  Discussed going for daily walks and doing some weight training with small hand-held weights to keep physically active.   I discussed the assessment and treatment plan with the patient. The patient was provided an opportunity to ask questions and all were answered. The patient agreed with the plan and demonstrated an understanding of the instructions.   The patient was advised to call back or seek an in-person evaluation if the symptoms worsen or if the condition fails to improve as anticipated.   Patient will follow-up in 3 months for recheck of chronic medical conditions and we will plan to get new blood work at that time.  Janna ArchLauren M  Leelan Rajewski, FNP

## 2019-02-08 NOTE — Telephone Encounter (Signed)
Please set up 3 month follow up for patient -- in office visit with NP and we will plan to do labs at that time  Thanks  LG

## 2019-02-08 NOTE — Telephone Encounter (Signed)
Called Pt and scheduled his F/U appt for 05/03/19 @ 8:20am

## 2019-04-29 ENCOUNTER — Other Ambulatory Visit: Payer: Self-pay

## 2019-05-03 ENCOUNTER — Encounter (INDEPENDENT_AMBULATORY_CARE_PROVIDER_SITE_OTHER): Payer: Self-pay

## 2019-05-03 ENCOUNTER — Other Ambulatory Visit: Payer: Self-pay

## 2019-05-03 ENCOUNTER — Encounter: Payer: Self-pay | Admitting: Family Medicine

## 2019-05-03 ENCOUNTER — Ambulatory Visit: Payer: 59 | Admitting: Family Medicine

## 2019-05-03 VITALS — BP 134/76 | HR 85 | Temp 98.1°F | Resp 18 | Ht 72.0 in | Wt 230.0 lb

## 2019-05-03 DIAGNOSIS — I10 Essential (primary) hypertension: Secondary | ICD-10-CM

## 2019-05-03 DIAGNOSIS — E782 Mixed hyperlipidemia: Secondary | ICD-10-CM | POA: Diagnosis not present

## 2019-05-03 DIAGNOSIS — J449 Chronic obstructive pulmonary disease, unspecified: Secondary | ICD-10-CM

## 2019-05-03 DIAGNOSIS — E119 Type 2 diabetes mellitus without complications: Secondary | ICD-10-CM

## 2019-05-03 LAB — CBC
HCT: 41.8 % (ref 39.0–52.0)
Hemoglobin: 14.1 g/dL (ref 13.0–17.0)
MCHC: 33.8 g/dL (ref 30.0–36.0)
MCV: 88.7 fl (ref 78.0–100.0)
Platelets: 298 10*3/uL (ref 150.0–400.0)
RBC: 4.71 Mil/uL (ref 4.22–5.81)
RDW: 14 % (ref 11.5–15.5)
WBC: 10.4 10*3/uL (ref 4.0–10.5)

## 2019-05-03 LAB — LIPID PANEL
Cholesterol: 119 mg/dL (ref 0–200)
HDL: 37.5 mg/dL — ABNORMAL LOW (ref >39.00)
NonHDL: 81.74
Total CHOL/HDL Ratio: 3
Triglycerides: 215 mg/dL — ABNORMAL HIGH (ref 0.0–149.0)
VLDL: 43 mg/dL — ABNORMAL HIGH (ref 0.0–40.0)

## 2019-05-03 LAB — LDL CHOLESTEROL, DIRECT: Direct LDL: 57 mg/dL

## 2019-05-03 LAB — TSH: TSH: 2.63 u[IU]/mL (ref 0.35–4.50)

## 2019-05-03 LAB — COMPREHENSIVE METABOLIC PANEL
ALT: 34 U/L (ref 0–53)
AST: 18 U/L (ref 0–37)
Albumin: 4 g/dL (ref 3.5–5.2)
Alkaline Phosphatase: 87 U/L (ref 39–117)
BUN: 15 mg/dL (ref 6–23)
CO2: 25 mEq/L (ref 19–32)
Calcium: 8.9 mg/dL (ref 8.4–10.5)
Chloride: 102 mEq/L (ref 96–112)
Creatinine, Ser: 0.59 mg/dL (ref 0.40–1.50)
GFR: 141.27 mL/min (ref >60.00)
Glucose, Bld: 141 mg/dL — ABNORMAL HIGH (ref 70–99)
Potassium: 4.2 mEq/L (ref 3.5–5.1)
Sodium: 138 mEq/L (ref 135–145)
Total Bilirubin: 0.8 mg/dL (ref 0.2–1.2)
Total Protein: 6.5 g/dL (ref 6.0–8.3)

## 2019-05-03 LAB — HEMOGLOBIN A1C: Hgb A1c MFr Bld: 7.5 % — ABNORMAL HIGH (ref 4.6–6.5)

## 2019-05-03 NOTE — Progress Notes (Signed)
Subjective:    Patient ID: Thomas Brandt, male    DOB: July 17, 1961, 58 y.o.   MRN: [id number]  HPI   Patient presents to clinic for follow-up on blood pressure, diabetes, breathing and lipids.   Blood pressure has been stable at home.  Usually he runs in the 130s over 70s or 80s when he checks it at home.  Takes amlodipine benazepril combination tablet and tolerates his medication without any problems.  Denies chest pain, palpitations, lower extremity swelling.  States usually when he comes into the office his blood pressure is high at first due to being a little nervous, but when it is rechecked tends to be better.  Tolerating high-dose statin without any problems.  Denies any abdominal pain or joint aches.  Checks his blood sugar usually once a day or every couple of days.  Tolerates Metformin without any problems.  Denies any feelings of hypoglycemia.  Usually when he checks his sugar is around the 130-160 ranges.  Tries to follow a healthier diet, but does snack on crackers and chips at times.  Breathing is stable.  Denies feeling short of breath or wheezing.  States walking longer distances or walking up stairs does not seem to affect his breathing at all.  He is a former smoker, quit when he had his stroke.  Patient Active Problem List   Diagnosis Date Noted  . Seasonal allergic rhinitis due to pollen 02/08/2019  . Essential hypertension 11/03/2018  . Type 2 diabetes mellitus without complication, without long-term current use of insulin (HCC) 11/03/2018  . Chronic obstructive pulmonary disease (HCC) 11/03/2018  . Mixed hyperlipidemia 11/03/2018  . History of CVA (cerebrovascular accident) 11/03/2018   Social History   Tobacco Use  . Smoking status: Former Smoker    Types: Cigarettes  . Smokeless tobacco: Never Used  Substance Use Topics  . Alcohol use: Not on file   Review of Systems   Constitutional: Negative for chills, fatigue and fever.  HENT: Negative for  congestion, ear pain, sinus pain and sore throat.   Eyes: Negative.   Respiratory: Negative for cough, shortness of breath and wheezing.   Cardiovascular: Negative for chest pain, palpitations and leg swelling.  Gastrointestinal: Negative for abdominal pain, diarrhea, nausea and vomiting.  Genitourinary: Negative for dysuria, frequency and urgency.  Musculoskeletal: Negative for arthralgias and myalgias.  Skin: Negative for color change, pallor and rash.  Neurological: Negative for syncope, light-headedness and headaches.  Psychiatric/Behavioral: The patient is not nervous/anxious.       Objective:   Physical Exam Vitals signs and nursing note reviewed.  Constitutional:      General: He is not in acute distress.    Appearance: He is not ill-appearing, toxic-appearing or diaphoretic.  HENT:     Head: Normocephalic and atraumatic.  Eyes:     General: No scleral icterus.    Extraocular Movements: Extraocular movements intact.     Conjunctiva/sclera: Conjunctivae normal.     Pupils: Pupils are equal, round, and reactive to light.  Neck:     Musculoskeletal: Normal range of motion and neck supple.  Cardiovascular:     Rate and Rhythm: Normal rate and regular rhythm.     Heart sounds: Normal heart sounds.  Abdominal:     General: Bowel sounds are normal. There is no distension.     Palpations: Abdomen is soft. There is no mass.     Tenderness: There is no abdominal tenderness. There is no guarding or rebound.  Musculoskeletal:  Right lower leg: No edema.     Left lower leg: No edema.  Skin:    General: Skin is warm and dry.     Coloration: Skin is not jaundiced or pale.  Neurological:     General: No focal deficit present.     Mental Status: He is alert.     Gait: Gait normal.  Psychiatric:        Mood and Affect: Mood normal.        Behavior: Behavior normal.        Thought Content: Thought content normal.        Judgment: Judgment normal.     Today's Vitals    05/03/19 0822 05/03/19 0836  BP: (!) 160/98 134/76  Pulse: 85   Resp: 18   Temp: 98.1 F (36.7 C)   TempSrc: Oral   SpO2: 94%   Weight: 230 lb (104.3 kg)   Height: 6' (1.829 m)    Body mass index is 31.19 kg/m.     Assessment & Plan:   Hypertension- tolerating amlodipine benazepril combination without any problems.  We will recheck in about a panel in clinic today to keep eye on electrolytes and kidney functions.  Recheck BP in clinic after CMA, pressure has come down to the 130s over 70s.  Hyperlipidemia-we will continue atorvastatin at this time.  We will see what his lipid panel results show, I will consider changing him over to Crestor versus taking a high dose atorvastatin.  Type 2 diabetes-tolerating metformin without any issues.  He will continue to check blood sugar to keep IN readings, he will continue to eat healthy diet and watch overall carbohydrate and sugar intake.  COPD-breathing is stable on current medications of incruse and symbicort. Will continue. He has no desire to re-start smoking, is glad he was able to quit.   Blood work collected in clinic today.  He is aware that flu vaccines will be available around September 2020 and that we will contact our patients and they are available.  Otherwise he will in approximately 6 months for recheck of chronic conditions and he will return to clinic sooner if any issues arise.  He is aware he can call us anytime with questions, concerns or need for medication refills.

## 2019-05-05 MED ORDER — ROSUVASTATIN CALCIUM 20 MG PO TABS: 10.0000 mg | ORAL_TABLET | Freq: Every day | ORAL | 3 refills | Status: DC

## 2019-05-05 MED ORDER — SITAGLIPTIN PHOSPHATE 25 MG PO TABS: 25.0000 mg | ORAL_TABLET | Freq: Every day | ORAL | 1 refills | Status: DC

## 2019-05-05 NOTE — Addendum Note (Signed)
Addended by: Philis Nettle on: 05/05/2019 03:23 PM   Modules accepted: Orders

## 2019-06-23 ENCOUNTER — Other Ambulatory Visit: Payer: Self-pay | Admitting: Family Medicine

## 2019-07-25 ENCOUNTER — Other Ambulatory Visit: Payer: Self-pay | Admitting: Family Medicine

## 2019-07-25 DIAGNOSIS — J449 Chronic obstructive pulmonary disease, unspecified: Secondary | ICD-10-CM

## 2019-07-28 ENCOUNTER — Other Ambulatory Visit: Payer: Self-pay | Admitting: Family Medicine

## 2019-07-28 DIAGNOSIS — J301 Allergic rhinitis due to pollen: Secondary | ICD-10-CM

## 2019-07-28 DIAGNOSIS — J449 Chronic obstructive pulmonary disease, unspecified: Secondary | ICD-10-CM

## 2019-11-04 ENCOUNTER — Telehealth: Payer: Self-pay | Admitting: Family Medicine

## 2019-11-04 ENCOUNTER — Other Ambulatory Visit: Payer: Self-pay | Admitting: *Deleted

## 2019-11-04 DIAGNOSIS — Z125 Encounter for screening for malignant neoplasm of prostate: Secondary | ICD-10-CM

## 2019-11-04 DIAGNOSIS — E119 Type 2 diabetes mellitus without complications: Secondary | ICD-10-CM

## 2019-11-04 DIAGNOSIS — I10 Essential (primary) hypertension: Secondary | ICD-10-CM

## 2019-11-04 DIAGNOSIS — E538 Deficiency of other specified B group vitamins: Secondary | ICD-10-CM

## 2019-11-04 DIAGNOSIS — J301 Allergic rhinitis due to pollen: Secondary | ICD-10-CM

## 2019-11-04 NOTE — Telephone Encounter (Signed)
Patient has an upcoming TOC with you ok to fill?

## 2019-11-04 NOTE — Progress Notes (Unsigned)
Has coming TOC with you ok to fill?

## 2019-11-04 NOTE — Telephone Encounter (Signed)
Pt needs a refill for vitamin B-12 (CYANOCOBALAMIN) 500 MCG tablet and claritin.

## 2019-11-05 ENCOUNTER — Other Ambulatory Visit: Payer: Self-pay

## 2019-11-05 DIAGNOSIS — I10 Essential (primary) hypertension: Secondary | ICD-10-CM

## 2019-11-05 DIAGNOSIS — E119 Type 2 diabetes mellitus without complications: Secondary | ICD-10-CM

## 2019-11-05 MED ORDER — INCRUSE ELLIPTA 62.5 MCG/INH IN AEPB: 1.0000 | INHALATION_SPRAY | Freq: Every day | RESPIRATORY_TRACT | 1 refills | Status: DC

## 2019-11-05 MED ORDER — LORATADINE 10 MG PO TABS: 10.0000 mg | ORAL_TABLET | Freq: Every day | ORAL | 0 refills | Status: DC

## 2019-11-05 MED ORDER — CETIRIZINE HCL 10 MG PO TABS: 10.0000 mg | ORAL_TABLET | Freq: Every day | ORAL | 11 refills | Status: DC

## 2019-11-05 MED ORDER — SITAGLIPTIN PHOSPHATE 25 MG PO TABS: 25.0000 mg | ORAL_TABLET | Freq: Every day | ORAL | 1 refills | Status: DC

## 2019-11-05 MED ORDER — VITAMIN B-12 500 MCG PO TABS: 500.0000 ug | ORAL_TABLET | Freq: Every day | ORAL | 0 refills | Status: DC

## 2019-11-05 MED ORDER — AMLODIPINE BESY-BENAZEPRIL HCL 5-20 MG PO CAPS: 1.0000 | ORAL_CAPSULE | Freq: Every day | ORAL | 3 refills | Status: DC

## 2019-11-05 NOTE — Telephone Encounter (Signed)
Call patient He has a transfer care appointment scheduled with [place] in March.  Please review labs I have ordered and ask him if these are okay with him.  I would like to have these labs done week prior to his visit so we can review them in the office.  Please schedule lab.

## 2019-11-11 NOTE — Telephone Encounter (Signed)
LMTCB

## 2019-11-12 ENCOUNTER — Telehealth: Payer: Self-pay | Admitting: Family

## 2019-11-12 NOTE — Telephone Encounter (Signed)
Pt is requesting a call back. He said you left him a message to call back.

## 2019-11-12 NOTE — Telephone Encounter (Signed)
I called patient & he is now scheduled fors before appointment on 3/29.

## 2019-11-12 NOTE — Telephone Encounter (Signed)
Reviewed order labs for patient & appointment scheduled.

## 2019-12-13 ENCOUNTER — Other Ambulatory Visit (INDEPENDENT_AMBULATORY_CARE_PROVIDER_SITE_OTHER): Payer: 59

## 2019-12-13 ENCOUNTER — Other Ambulatory Visit: Payer: Self-pay

## 2019-12-13 DIAGNOSIS — E538 Deficiency of other specified B group vitamins: Secondary | ICD-10-CM | POA: Diagnosis not present

## 2019-12-13 DIAGNOSIS — I10 Essential (primary) hypertension: Secondary | ICD-10-CM | POA: Diagnosis not present

## 2019-12-13 DIAGNOSIS — Z125 Encounter for screening for malignant neoplasm of prostate: Secondary | ICD-10-CM

## 2019-12-13 DIAGNOSIS — E119 Type 2 diabetes mellitus without complications: Secondary | ICD-10-CM | POA: Diagnosis not present

## 2019-12-13 LAB — COMPREHENSIVE METABOLIC PANEL
ALT: 36 U/L (ref 0–53)
AST: 23 U/L (ref 0–37)
Albumin: 4.1 g/dL (ref 3.5–5.2)
Alkaline Phosphatase: 66 U/L (ref 39–117)
BUN: 15 mg/dL (ref 6–23)
CO2: 28 mEq/L (ref 19–32)
Calcium: 9.1 mg/dL (ref 8.4–10.5)
Chloride: 100 mEq/L (ref 96–112)
Creatinine, Ser: 0.75 mg/dL (ref 0.40–1.50)
GFR: 106.87 mL/min (ref >60.00)
Glucose, Bld: 164 mg/dL — ABNORMAL HIGH (ref 70–99)
Potassium: 4.7 mEq/L (ref 3.5–5.1)
Sodium: 137 mEq/L (ref 135–145)
Total Bilirubin: 0.4 mg/dL (ref 0.2–1.2)
Total Protein: 6.8 g/dL (ref 6.0–8.3)

## 2019-12-13 LAB — LIPID PANEL
Cholesterol: 163 mg/dL (ref 0–200)
HDL: 43.4 mg/dL (ref >39.00)
NonHDL: 119.59
Total CHOL/HDL Ratio: 4
Triglycerides: 288 mg/dL — ABNORMAL HIGH (ref 0.0–149.0)
VLDL: 57.6 mg/dL — ABNORMAL HIGH (ref 0.0–40.0)

## 2019-12-13 LAB — HEMOGLOBIN A1C: Hgb A1c MFr Bld: 6.8 % — ABNORMAL HIGH (ref 4.6–6.5)

## 2019-12-13 LAB — B12 AND FOLATE PANEL
Folate: 3.8 ng/mL — ABNORMAL LOW (ref >5.9)
Vitamin B-12: 333 pg/mL (ref 211–911)

## 2019-12-13 LAB — LDL CHOLESTEROL, DIRECT: Direct LDL: 86 mg/dL

## 2019-12-13 LAB — PSA: PSA: 0.38 ng/mL (ref 0.10–4.00)

## 2019-12-13 LAB — TSH: TSH: 3.47 u[IU]/mL (ref 0.35–4.50)

## 2019-12-14 NOTE — Progress Notes (Signed)
Subjective:    Patient ID: Thomas Brandt, male    DOB: 06-15-1961, 59 y.o.   MRN: [id number]  CC: [name] is a 59 y.o. male who presents today to establish care.    HPI: Feels well today.   HTN- compliant with lotrel , at home, 140s/ 68. No CP, HA, vision changes.    DM- compliant with metformin and januvia.   COPD- off the Incruse as ran out of the medication, started by pulmonology. Still on symbicort.  Can tell that since off the Incruse he feels more short of breath, wheezing. No leg swelling, orthopnea.  Former smoker.   H/o CVA- on 81 mg aspirin. Never on plavix  HLD- forgotful in taking crestor.    HISTORY:  Past Medical History:  Diagnosis Date  . Hyperlipidemia   . Hypertension   . Stroke [facility]) 2018   History reviewed. No pertinent surgical history. Family History  Problem Relation Age of Onset  . Heart disease Father     Allergies: Patient has no allergy information on record. Current Outpatient Medications on File Prior to Visit  Medication Sig Dispense Refill  . aspirin 81 MG chewable tablet Chew by mouth daily.    . cetirizine (ZYRTEC) 10 MG tablet Take 1 tablet (10 mg total) by mouth daily. 30 tablet 11  . cholecalciferol (VITAMIN D) 400 units TABS tablet Take 1,000 Units by mouth.    . metFORMIN (GLUCOPHAGE) 1000 MG tablet Take 1 tablet (1,000 mg total) by mouth 2 (two) times daily with a meal. 180 tablet 3  . montelukast (SINGULAIR) 10 MG tablet TAKE 1 TABLET BY MOUTH EVERYDAY AT BEDTIME 90 tablet 1  . sitaGLIPtin (JANUVIA) 25 MG tablet Take 1 tablet (25 mg total) by mouth daily. 90 tablet 1  . vitamin B-12 (CYANOCOBALAMIN) 500 MCG tablet Take 1 tablet (500 mcg total) by mouth daily. 100 tablet 0   No current facility-administered medications on file prior to visit.    Social History   Tobacco Use  . Smoking status: Former Smoker    Types: Cigarettes    Quit date: 09/30/2014    Years since quitting: 5.2  . Smokeless tobacco: Never  Used  Substance Use Topics  . Alcohol use: Not on file  . Drug use: Not on file    Review of Systems  Constitutional: Negative for chills and fever.  Respiratory: Negative for cough.   Cardiovascular: Negative for chest pain and palpitations.  Gastrointestinal: Negative for nausea and vomiting.      Objective:    BP (!) 150/68   Pulse 90   Temp 98 F (36.7 C)   Ht 6' (1.829 m)   Wt 226 lb 12.8 oz (102.9 kg)   SpO2 97%   BMI 30.76 kg/m  BP Readings from Last 3 Encounters:  12/27/19 (!) 150/68  05/03/19 134/76  11/02/18 126/68   Wt Readings from Last 3 Encounters:  12/27/19 226 lb 12.8 oz (102.9 kg)  05/03/19 230 lb (104.3 kg)  11/02/18 222 lb 2 oz (100.8 kg)    Physical Exam Vitals reviewed.  Constitutional:      Appearance: He is well-developed.  Cardiovascular:     Rate and Rhythm: Regular rhythm.     Heart sounds: Normal heart sounds.  Pulmonary:     Effort: Pulmonary effort is normal. No respiratory distress.     Breath sounds: Normal breath sounds. No wheezing, rhonchi or rales.     Comments: Occassioanl expiratory wheeze heard on  exam.  Skin:    General: Skin is warm and dry.  Neurological:     Mental Status: He is alert.  Psychiatric:        Speech: Speech normal.        Behavior: Behavior normal.        Assessment & Plan:   Problem List Items Addressed This Visit      Cardiovascular and Mediastinum   Essential hypertension - Primary    Uncontrolled.  Increase Lotrel.  Follow-up in 3 months      Relevant Medications   rosuvastatin (CRESTOR) 10 MG tablet   amLODipine-benazepril (LOTREL) 10-20 MG capsule     Respiratory   Chronic obstructive pulmonary disease (HCC)    Uncontrolled.  Increase symbicort dose. Discontinued Incruse ( LAMA)as not covered by insurance. Will defer further medication changes until seen by pulmonology. Referral to pulmonology      Relevant Medications   budesonide-formoterol (SYMBICORT) 160-4.5 MCG/ACT inhaler    Other Relevant Orders   Ambulatory referral to Pulmonology   CT CHEST LUNG CA SCREEN LOW DOSE W/O CM     Endocrine   Type 2 diabetes mellitus without complication, without long-term current use of insulin ([place])    Close to goal of 6.5%.  Encourage patient to make changes to diet to further reach this goal.  No change in medication  regimen at this time      Relevant Medications   rosuvastatin (CRESTOR) 10 MG tablet   amLODipine-benazepril (LOTREL) 10-20 MG capsule     Other   Mixed hyperlipidemia    Uncontrolled. He has not been taking crestor consistently, he will start taking in the evenings, daily and we will follow cholesterol to see if we reach goal < 70.       Relevant Medications   rosuvastatin (CRESTOR) 10 MG tablet   amLODipine-benazepril (LOTREL) 10-20 MG capsule       I have discontinued Heinz Knuckles. Kovacich's rosuvastatin, Symbicort, loratadine, amLODipine-benazepril, Incruse Ellipta, and Incruse Ellipta. I am also having him start on rosuvastatin, amLODipine-benazepril, and budesonide-formoterol. Additionally, I am having him maintain his cholecalciferol, aspirin, metFORMIN, montelukast, vitamin B-12, cetirizine, and sitaGLIPtin.   Meds ordered this encounter  Medications  . rosuvastatin (CRESTOR) 10 MG tablet    Sig: Take 1 tablet (10 mg total) by mouth daily.    Dispense:  90 tablet    Refill:  3    Order Specific Question:   Supervising Provider    Answer:   Deborra Medina L [[id number]]  . DISCONTD: umeclidinium bromide (INCRUSE ELLIPTA) 62.5 MCG/INH AEPB    Sig: Inhale 1 puff into the lungs daily.    Dispense:  90 each    Refill:  1    Please fill GENERIC as this is covered by patient's insurance.    Order Specific Question:   Supervising Provider    Answer:   Crecencio Mc [[id number]]  . amLODipine-benazepril (LOTREL) 10-20 MG capsule    Sig: Take 1 capsule by mouth at bedtime.    Dispense:  90 capsule    Refill:  1    Order Specific Question:   Supervising  Provider    Answer:   Derrel Nip, TERESA L [[id number]]  . budesonide-formoterol (SYMBICORT) 160-4.5 MCG/ACT inhaler    Sig: Inhale 2 puffs into the lungs 2 (two) times daily.    Dispense:  1 Inhaler    Refill:  3    Order Specific Question:   Supervising Provider    Answer:  TULLO, TERESA L [[id number]]    Return precautions given.   Risks, benefits, and alternatives of the medications and treatment plan prescribed today were discussed, and patient expressed understanding.   Education regarding symptom management and diagnosis given to patient on AVS.  Continue to follow with Allegra Grana, FNP for routine health maintenance.   Lucky Rathke and I agreed with plan.   Rennie Plowman, FNP

## 2019-12-17 ENCOUNTER — Other Ambulatory Visit: Payer: Self-pay

## 2019-12-23 ENCOUNTER — Other Ambulatory Visit: Payer: Self-pay

## 2019-12-27 ENCOUNTER — Ambulatory Visit: Payer: 59 | Admitting: Family

## 2019-12-27 ENCOUNTER — Other Ambulatory Visit: Payer: Self-pay

## 2019-12-27 ENCOUNTER — Telehealth: Payer: Self-pay | Admitting: Family

## 2019-12-27 ENCOUNTER — Encounter: Payer: Self-pay | Admitting: Family

## 2019-12-27 VITALS — BP 150/68 | HR 90 | Temp 98.0°F | Ht 72.0 in | Wt 226.8 lb

## 2019-12-27 DIAGNOSIS — E119 Type 2 diabetes mellitus without complications: Secondary | ICD-10-CM | POA: Diagnosis not present

## 2019-12-27 DIAGNOSIS — J449 Chronic obstructive pulmonary disease, unspecified: Secondary | ICD-10-CM

## 2019-12-27 DIAGNOSIS — E782 Mixed hyperlipidemia: Secondary | ICD-10-CM | POA: Diagnosis not present

## 2019-12-27 DIAGNOSIS — I10 Essential (primary) hypertension: Secondary | ICD-10-CM | POA: Diagnosis not present

## 2019-12-27 MED ORDER — AMLODIPINE BESY-BENAZEPRIL HCL 10-20 MG PO CAPS: 1.0000 | ORAL_CAPSULE | Freq: Every day | ORAL | 1 refills | Status: DC

## 2019-12-27 MED ORDER — ROSUVASTATIN CALCIUM 10 MG PO TABS: 10.0000 mg | ORAL_TABLET | Freq: Every day | ORAL | 3 refills | Status: DC

## 2019-12-27 MED ORDER — BUDESONIDE-FORMOTEROL FUMARATE 160-4.5 MCG/ACT IN AERO: 2.0000 | INHALATION_SPRAY | Freq: Two times a day (BID) | RESPIRATORY_TRACT | 3 refills | Status: DC

## 2019-12-27 MED ORDER — INCRUSE ELLIPTA 62.5 MCG/INH IN AEPB: 1.0000 | INHALATION_SPRAY | Freq: Every day | RESPIRATORY_TRACT | 1 refills | Status: DC

## 2019-12-27 NOTE — Telephone Encounter (Signed)
Call pt  I have decided to  Increase symbicort dose. He is low dose and in COPD it is recommended the 160 mcg,   Discontinued Incruse ( LAMA)as not covered by insurance.   Will defer further medication changes until seen by pulmonology. Certainly tell him to call us with any concerns.   Referral placed today

## 2019-12-27 NOTE — Telephone Encounter (Signed)
Patient called and notified that increased dose was sent. I asked that he call us if he does hear about referral.

## 2019-12-27 NOTE — Telephone Encounter (Signed)
I tried to call patient, but VM was full & could not leave message.  

## 2019-12-27 NOTE — Assessment & Plan Note (Signed)
Uncontrolled. He has not been taking crestor consistently, he will start taking in the evenings, daily and we will follow cholesterol to see if we reach goal < 70.

## 2019-12-27 NOTE — Telephone Encounter (Signed)
Pt called and said the incruse prescription that was called in needs to be changed. He said that they do not have a generic brand and his insurance doesn't cover this medication.

## 2019-12-27 NOTE — Assessment & Plan Note (Addendum)
Uncontrolled.  Increase symbicort dose. Discontinued Incruse ( LAMA)as not covered by insurance. Will defer further medication changes until seen by pulmonology. Referral to pulmonology

## 2019-12-27 NOTE — Patient Instructions (Addendum)
I have increased your Lotrel.    You will take amlodipine 10 mg versus previously amlodipine 5 mg.  The lisinopril dose remain the same 20 mg.  We would not have to check labs since I only increased the amlodipine.   take crestor 10mg  daily.   Referral to pulmonology and also a CT lung cancer screening program at the cancer center.  Please give a call if you do not get a call in regards to both of these appointments.  Per cdc:   People who have previously had Bell's palsy People who have previously had Bell's palsy may receive a COVID-19 vaccine. Cases of Bell's palsy were reported following vaccination in participants in the COVID-19 vaccine clinical trials. However, the Food and Drug Administration (FDA) does not consider these to be more than the rate expected in the general population. They have not concluded these cases were caused by vaccination.  Stay safe!

## 2019-12-27 NOTE — Telephone Encounter (Signed)
I called pharmacy what is covered is the spiriva handi-haler. That is also what is more comparable.

## 2019-12-27 NOTE — Assessment & Plan Note (Signed)
Close to goal of 6.5%.  Encourage patient to make changes to diet to further reach this goal.  No change in medication  regimen at this time

## 2019-12-27 NOTE — Assessment & Plan Note (Signed)
Uncontrolled.  Increase Lotrel.  Follow-up in 3 months

## 2019-12-30 ENCOUNTER — Telehealth: Payer: Self-pay | Admitting: *Deleted

## 2019-12-30 NOTE — Telephone Encounter (Signed)
Contacted regarding schedule lung screening scan. Patient reports he will call me back when he has his schedule available.

## 2020-02-03 ENCOUNTER — Telehealth: Payer: Self-pay | Admitting: Family

## 2020-02-03 DIAGNOSIS — J449 Chronic obstructive pulmonary disease, unspecified: Secondary | ICD-10-CM

## 2020-02-03 MED ORDER — BUDESONIDE-FORMOTEROL FUMARATE 160-4.5 MCG/ACT IN AERO: 2.0000 | INHALATION_SPRAY | Freq: Two times a day (BID) | RESPIRATORY_TRACT | 3 refills | Status: DC

## 2020-02-03 NOTE — Telephone Encounter (Signed)
Patient needs his budesonide-formoterol (SYMBICORT) 160-4.5 MCG/ACT inhaler [facility] will not fill it.

## 2020-02-09 ENCOUNTER — Other Ambulatory Visit: Payer: Self-pay | Admitting: Family

## 2020-02-09 DIAGNOSIS — E538 Deficiency of other specified B group vitamins: Secondary | ICD-10-CM

## 2020-02-29 ENCOUNTER — Ambulatory Visit: Payer: 59 | Admitting: Pulmonary Disease

## 2020-02-29 ENCOUNTER — Other Ambulatory Visit: Payer: Self-pay

## 2020-02-29 ENCOUNTER — Encounter: Payer: Self-pay | Admitting: Pulmonary Disease

## 2020-02-29 VITALS — BP 158/70 | HR 89 | Temp 97.8°F | Ht 72.0 in | Wt 227.8 lb

## 2020-02-29 DIAGNOSIS — I059 Rheumatic mitral valve disease, unspecified: Secondary | ICD-10-CM

## 2020-02-29 DIAGNOSIS — Q231 Congenital insufficiency of aortic valve: Secondary | ICD-10-CM | POA: Diagnosis not present

## 2020-02-29 DIAGNOSIS — J449 Chronic obstructive pulmonary disease, unspecified: Secondary | ICD-10-CM

## 2020-02-29 DIAGNOSIS — Z2821 Immunization not carried out because of patient refusal: Secondary | ICD-10-CM

## 2020-02-29 DIAGNOSIS — F17210 Nicotine dependence, cigarettes, uncomplicated: Secondary | ICD-10-CM | POA: Diagnosis not present

## 2020-02-29 MED ORDER — SPIRIVA RESPIMAT 2.5 MCG/ACT IN AERS: 2.0000 | INHALATION_SPRAY | Freq: Every day | RESPIRATORY_TRACT | 0 refills | Status: DC

## 2020-02-29 MED ORDER — SPIRIVA RESPIMAT 2.5 MCG/ACT IN AERS: 2.0000 | INHALATION_SPRAY | Freq: Every day | RESPIRATORY_TRACT | 11 refills | Status: DC

## 2020-02-29 NOTE — Progress Notes (Signed)
Subjective:    Patient ID: Thomas Brandt, male    DOB: 10/04/1960, 59 y.o.   MRN: [id number]  HPI Patient is a 59 year old current smoker (4 to 5 cigarettes/day) who presents for evaluation of increased congestion and to establish care for management of COPD.  He is kindly referred by Rennie Plowman, FNP.  The patient states that he has carried a diagnosis of COPD for approximately 5 years.  Approximately 2 to 3 months ago he started having increased congestion which is a usual pattern for him with seasonal changes.  Previously he had been on Spiriva HandiHaler as well as Symbicort and noticed that this combination worked fairly well for him.  Spiriva was switched to Incruse but now he cannot get this medication due to insurance lack of coverage.  He is referred for further medication management of his COPD.  He does not endorse any fevers, chills or sweats.  No chest pain.  States that he is doing well with the exception of his increased congestion.  He is a somewhat reluctant historian due to hostility towards the examiner due to the perception of being in an inefficient and uncaring medical system.  We try to reassure the patient that we are trying to assist him with his medical care.  He does endorse heartburn on occasion.  He did not endorse any other symptomatology.   Review of Systems A 10 point review of systems was performed and it is as noted above otherwise negative.  However difficult to obtain as the patient is somewhat hostile.  Past Medical History:  Diagnosis Date  . Hyperlipidemia   . Hypertension   . Stroke [facility]) 2018   History reviewed. No pertinent surgical history. Family History  Problem Relation Age of Onset  . Heart disease Father    Social History   Tobacco Use  . Smoking status: Current Some Day Smoker    Packs/day: 0.75    Years: 39.00    Pack years: 29.25    Types: Cigarettes    Start date: 02/29/1980  . Smokeless tobacco: Never Used  . Tobacco  comment: 3-4 cigarettes a day currently  Substance Use Topics  . Alcohol use: Not on file   Works as a Advice worker.  No military history.  No exotic pets.  Does have dogs in the home.   Has lived in [place] all his life  No Known Allergies  Current Meds  Medication Sig  . amLODipine-benazepril (LOTREL) 10-20 MG capsule Take 1 capsule by mouth at bedtime.  Marland Kitchen aspirin 81 MG chewable tablet Chew by mouth daily.  . budesonide-formoterol (SYMBICORT) 160-4.5 MCG/ACT inhaler Inhale 2 puffs into the lungs 2 (two) times daily.  . cetirizine (ZYRTEC) 10 MG tablet Take 1 tablet (10 mg total) by mouth daily.  . cholecalciferol (VITAMIN D) 400 units TABS tablet Take 1,000 Units by mouth.  . metFORMIN (GLUCOPHAGE) 1000 MG tablet Take 1 tablet (1,000 mg total) by mouth 2 (two) times daily with a meal.  . montelukast (SINGULAIR) 10 MG tablet TAKE 1 TABLET BY MOUTH EVERYDAY AT BEDTIME  . rosuvastatin (CRESTOR) 10 MG tablet Take 1 tablet (10 mg total) by mouth daily.  . sitaGLIPtin (JANUVIA) 25 MG tablet Take 1 tablet (25 mg total) by mouth daily.  . vitamin B-12 (CYANOCOBALAMIN) 500 MCG tablet TAKE 1 TABLET BY MOUTH DAILY        Objective:   Physical Exam BP (!) 158/70 (BP Location: Left Arm, Patient Position: Sitting, Cuff Size:  Large)   Pulse 89   Temp 97.8 F (36.6 C) (Temporal)   Ht 6' (1.829 m)   Wt 227 lb 12.8 oz (103.3 kg)   SpO2 95%   BMI 30.90 kg/m   GENERAL: Awake, alert, somewhat hostile.  Fully ambulatory. HEAD: Normocephalic, atraumatic.  EYES: Pupils equal, round, reactive to light.  No scleral icterus.  MOUTH: Nose/mouth/throat not examined due to masking requirements for COVID 19. NECK: Supple. No thyromegaly. Trachea midline. No JVD.  No adenopathy. PULMONARY: Scattered end expiratory wheezes.  Coarse breath sounds.  No other adventitious sounds. CARDIOVASCULAR: S1 and S2. Regular rate and rhythm.  He has a grade 2/6 holosystolic murmur at the left sternal border  and a grade 2/6 harsh murmur on the right sternal border. GASTROINTESTINAL: Benign. MUSCULOSKELETAL: No joint deformity, no clubbing, no edema.  NEUROLOGIC: Awake alert, no overt focal deficit.  Fully ambulatory with no overt gait deficit. SKIN: Intact,warm,dry.  Limited exam no rashes PSYCH: Mood is angry.  Behavior is guarded.      Assessment & Plan:     ICD-10-CM   1. Chronic obstructive pulmonary disease, unspecified COPD type ([place])  J44.9 DG Chest 2 View    Pulmonary Function Test [facility] Only    ECHOCARDIOGRAM COMPLETE   Will obtain PFTs and chest x-ray Add Spiriva Respimat 2 puffs daily Continue Symbicort 2 puffs twice daily Advised with regards to smoking cessation  2. Mitral valve disorder  I05.9 ECHOCARDIOGRAM COMPLETE   Cardiac murmur noted Prior 2D echo 2018 [facility] show mitral regurgitation 2D echo to reestablish baseline  3. Bicuspid aortic valve  Q23.1    2D echo as above to reestablish baseline Aortic insufficiency due to bicuspid valve  4. Tobacco dependence due to cigarettes  F17.210    Patient counseled regards to discontinuation of smoking 3 to 5 minutes Declines lung cancer screening, advised to reconsider  5. COVID-19 virus vaccination declined  Z76.21    Counseled with regards to COVID-19 vaccine Patient declines on the grounds that he has had Bell's palsy previosly   Meds ordered this encounter  Medications  . Tiotropium Bromide Monohydrate (SPIRIVA RESPIMAT) 2.5 MCG/ACT AERS    Sig: Inhale 2 puffs into the lungs daily.    Dispense:  4 g    Refill:  0    Order Specific Question:   Lot Number?    Answer:   [id number]    Order Specific Question:   Expiration Date?    Answer:   12/27/2020    Order Specific Question:   Quantity    Answer:   1  . Tiotropium Bromide Monohydrate (SPIRIVA RESPIMAT) 2.5 MCG/ACT AERS    Sig: Inhale 2 puffs into the lungs daily.    Dispense:  4 g    Refill:  11   Discussion:  Patient has had diagnosis of COPD for approximately 5  years.  Examination today shows that he has significant bronchospasm.  He apparently has done well previously on Spiriva.  We will resume Spiriva Respimat 2 inhalations daily.  He is to continue Symbicort 160/4.5 twice a day.  On today's exam he also was noted to have 2 distinct cardiac murmurs.  He has a history of mitral insufficiency as well as bicuspid aortic valve.  [facility] of CVA in [facility] at [facility].  Will obtain 2D echo to reevaluate these issues. He declines lung cancer screening, counseled with regards to this issue and recommended that he enroll in lung cancer screening.  Also declines COVID-19 vaccination after  counseling, he has had issues with Bell's palsy previously in is concerned about recurrence of this with the vaccines. Lastly, he is on Lotrel which is a combination of amlodipine and and benazepril if he continues to have issues with "congestion" would recommend switching to amlodipine/ARB combination to see if removing the ACE inhibitor component helps. Patient was counseled regards to discontinuation of smoking.  We will see the patient in follow-up in 3 months time he is to contact us prior to that time should any new difficulties arise.   Gailen Shelter, MD [facility]   *This note was dictated using voice recognition software/Dragon.  Despite best efforts to proofread, errors can occur which can change the meaning.  Any change was purely unintentional.

## 2020-02-29 NOTE — Patient Instructions (Signed)
We are going to get breathing tests, chest x-ray and an echocardiogram (heart test).  We have restarted Spiriva 2 puffs 1 times a day this is with the Respimat device.  Consider the lung cancer screening program you are reached by Gwenevere Ghazi previously he is the one that schedules the CTs.  See you in follow-up in 3 months time call sooner should any new difficulties arise.

## 2020-03-02 ENCOUNTER — Telehealth: Payer: Self-pay | Admitting: Family

## 2020-03-02 DIAGNOSIS — E119 Type 2 diabetes mellitus without complications: Secondary | ICD-10-CM

## 2020-03-02 MED ORDER — METFORMIN HCL 1000 MG PO TABS: 1000.0000 mg | ORAL_TABLET | Freq: Two times a day (BID) | ORAL | 3 refills | Status: DC

## 2020-03-02 NOTE — Telephone Encounter (Signed)
Pt needs a refill on metFORMIN (GLUCOPHAGE) 1000 MG tablet. Pt has a couple left

## 2020-03-07 ENCOUNTER — Ambulatory Visit: Payer: 59 | Admitting: Family Medicine

## 2020-03-17 ENCOUNTER — Telehealth: Payer: Self-pay

## 2020-03-17 NOTE — Telephone Encounter (Signed)
Pt is aware of date/time of covid test prior to PFT. Nothing further is needed.  

## 2020-03-20 ENCOUNTER — Other Ambulatory Visit: Payer: Self-pay

## 2020-03-20 ENCOUNTER — Other Ambulatory Visit
Admission: RE | Admit: 2020-03-20 | Discharge: 2020-03-20 | Disposition: A | Discharge status: Home / Self Care | Payer: 59 | Source: Ambulatory Visit | Attending: Pulmonary Disease | Admitting: Pulmonary Disease

## 2020-03-20 DIAGNOSIS — Z01812 Encounter for preprocedural laboratory examination: Secondary | ICD-10-CM | POA: Insufficient documentation

## 2020-03-20 DIAGNOSIS — Z20822 Contact with and (suspected) exposure to covid-19: Secondary | ICD-10-CM | POA: Insufficient documentation

## 2020-03-21 ENCOUNTER — Ambulatory Visit (HOSPITAL_COMMUNITY): Payer: 59

## 2020-03-21 ENCOUNTER — Ambulatory Visit
Admission: RE | Admit: 2020-03-21 | Discharge: 2020-03-21 | Disposition: A | Discharge status: Home / Self Care | Payer: 59 | Source: Ambulatory Visit | Attending: Pulmonary Disease | Admitting: Pulmonary Disease

## 2020-03-21 DIAGNOSIS — I083 Combined rheumatic disorders of mitral, aortic and tricuspid valves: Secondary | ICD-10-CM | POA: Insufficient documentation

## 2020-03-21 DIAGNOSIS — E785 Hyperlipidemia, unspecified: Secondary | ICD-10-CM | POA: Diagnosis not present

## 2020-03-21 DIAGNOSIS — J449 Chronic obstructive pulmonary disease, unspecified: Secondary | ICD-10-CM

## 2020-03-21 DIAGNOSIS — I10 Essential (primary) hypertension: Secondary | ICD-10-CM | POA: Diagnosis not present

## 2020-03-21 DIAGNOSIS — Z7951 Long term (current) use of inhaled steroids: Secondary | ICD-10-CM | POA: Insufficient documentation

## 2020-03-21 DIAGNOSIS — I059 Rheumatic mitral valve disease, unspecified: Secondary | ICD-10-CM

## 2020-03-21 LAB — SARS CORONAVIRUS 2 (TAT 6-24 HRS): SARS Coronavirus 2: NEGATIVE

## 2020-03-21 MED ORDER — ALBUTEROL SULFATE (2.5 MG/3ML) 0.083% IN NEBU
2.5000 mg | INHALATION_SOLUTION | Freq: Once | RESPIRATORY_TRACT | Status: AC
  Filled 2020-03-21: qty 3

## 2020-03-21 NOTE — Progress Notes (Signed)
*  PRELIMINARY RESULTS* Echocardiogram 2D Echocardiogram has been performed.  Thomas Brandt 03/21/2020, 10:50 AM

## 2020-03-28 ENCOUNTER — Other Ambulatory Visit: Payer: Self-pay | Admitting: Family

## 2020-03-28 DIAGNOSIS — J449 Chronic obstructive pulmonary disease, unspecified: Secondary | ICD-10-CM

## 2020-03-29 ENCOUNTER — Ambulatory Visit: Payer: 59 | Admitting: Family

## 2020-03-29 ENCOUNTER — Other Ambulatory Visit: Payer: Self-pay

## 2020-03-29 ENCOUNTER — Encounter: Payer: Self-pay | Admitting: Family

## 2020-03-29 VITALS — BP 150/70 | HR 88 | Temp 98.3°F | Ht 72.01 in | Wt 226.2 lb

## 2020-03-29 DIAGNOSIS — E119 Type 2 diabetes mellitus without complications: Secondary | ICD-10-CM | POA: Diagnosis not present

## 2020-03-29 DIAGNOSIS — Z1159 Encounter for screening for other viral diseases: Secondary | ICD-10-CM

## 2020-03-29 DIAGNOSIS — I10 Essential (primary) hypertension: Secondary | ICD-10-CM | POA: Diagnosis not present

## 2020-03-29 DIAGNOSIS — E782 Mixed hyperlipidemia: Secondary | ICD-10-CM | POA: Diagnosis not present

## 2020-03-29 DIAGNOSIS — Z23 Encounter for immunization: Secondary | ICD-10-CM

## 2020-03-29 MED ORDER — HYDROCHLOROTHIAZIDE 12.5 MG PO CAPS
12.5000 mg | ORAL_CAPSULE | Freq: Every day | ORAL | 1 refills | Status: DC
Start: 2020-03-29 — End: 2020-07-23

## 2020-03-29 NOTE — Addendum Note (Signed)
Addended by: Larry Sierras on: 03/29/2020 02:55 PM   Modules accepted: Orders

## 2020-03-29 NOTE — Assessment & Plan Note (Signed)
Pending a1c. 

## 2020-03-29 NOTE — Patient Instructions (Signed)
Please make eye exam appointment   Start hctz 12.5mg  in addition to Lotrel for blood pressure control  Please STOP mucinex D and choose PLAIN mucinex as anything with a decongestant can elevate blood pressure  It is imperative that you are seen AT least twice per year for labs and monitoring. Monitor blood pressure at home and me 5-6 reading on separate days. Goal is less than 120/80, based on newest guidelines, however we certainly want to be less than 130/80;  if persistently higher, please make sooner follow up appointment so we can recheck you blood pressure and manage/ adjust medications.    Managing Your Hypertension Hypertension is commonly called high blood pressure. This is when the force of your blood pressing against the walls of your arteries is too strong. Arteries are blood vessels that carry blood from your heart throughout your body. Hypertension forces the heart to work harder to pump blood, and may cause the arteries to become narrow or stiff. Having untreated or uncontrolled hypertension can cause heart attack, stroke, kidney disease, and other problems. What are blood pressure readings? A blood pressure reading consists of a higher number over a lower number. Ideally, your blood pressure should be below 120/80. The first ("top") number is called the systolic pressure. It is a measure of the pressure in your arteries as your heart beats. The second ("bottom") number is called the diastolic pressure. It is a measure of the pressure in your arteries as the heart relaxes. What does my blood pressure reading mean? Blood pressure is classified into four stages. Based on your blood pressure reading, your health care provider may use the following stages to determine what type of treatment you need, if any. Systolic pressure and diastolic pressure are measured in a unit called mm Hg. Normal  Systolic pressure: below 120.  Diastolic pressure: below 80. Elevated  Systolic pressure:  120-129.  Diastolic pressure: below 80. Hypertension stage 1  Systolic pressure: 130-139.  Diastolic pressure: 80-89. Hypertension stage 2  Systolic pressure: 140 or above.  Diastolic pressure: 90 or above. What health risks are associated with hypertension? Managing your hypertension is an important responsibility. Uncontrolled hypertension can lead to:  A heart attack.  A stroke.  A weakened blood vessel (aneurysm).  Heart failure.  Kidney damage.  Eye damage.  Metabolic syndrome.  Memory and concentration problems. What changes can I make to manage my hypertension? Hypertension can be managed by making lifestyle changes and possibly by taking medicines. Your health care provider will help you make a plan to bring your blood pressure within a normal range. Eating and drinking   Eat a diet that is high in fiber and potassium, and low in salt (sodium), added sugar, and fat. An example eating plan is called the DASH (Dietary Approaches to Stop Hypertension) diet. To eat this way: ? Eat plenty of fresh fruits and vegetables. Try to fill half of your plate at each meal with fruits and vegetables. ? Eat whole grains, such as whole wheat pasta, brown rice, or whole grain bread. Fill about one quarter of your plate with whole grains. ? Eat low-fat diary products. ? Avoid fatty cuts of meat, processed or cured meats, and poultry with skin. Fill about one quarter of your plate with lean proteins such as fish, chicken without skin, beans, eggs, and tofu. ? Avoid premade and processed foods. These tend to be higher in sodium, added sugar, and fat.  Reduce your daily sodium intake. Most people with hypertension  should eat less than 1,500 mg of sodium a day.  Limit alcohol intake to no more than 1 drink a day for nonpregnant women and 2 drinks a day for men. One drink equals 12 oz of beer, 5 oz of wine, or 1 oz of hard liquor. Lifestyle  Work with your health care provider to  maintain a healthy body weight, or to lose weight. Ask what an ideal weight is for you.  Get at least 30 minutes of exercise that causes your heart to beat faster (aerobic exercise) most days of the week. Activities may include walking, swimming, or biking.  Include exercise to strengthen your muscles (resistance exercise), such as weight lifting, as part of your weekly exercise routine. Try to do these types of exercises for 30 minutes at least 3 days a week.  Do not use any products that contain nicotine or tobacco, such as cigarettes and e-cigarettes. If you need help quitting, ask your health care provider.  Control any long-term (chronic) conditions you have, such as high cholesterol or diabetes. Monitoring  Monitor your blood pressure at home as told by your health care provider. Your personal target blood pressure may vary depending on your medical conditions, your age, and other factors.  Have your blood pressure checked regularly, as often as told by your health care provider. Working with your health care provider  Review all the medicines you take with your health care provider because there may be side effects or interactions.  Talk with your health care provider about your diet, exercise habits, and other lifestyle factors that may be contributing to hypertension.  Visit your health care provider regularly. Your health care provider can help you create and adjust your plan for managing hypertension. Will I need medicine to control my blood pressure? Your health care provider may prescribe medicine if lifestyle changes are not enough to get your blood pressure under control, and if:  Your systolic blood pressure is 130 or higher.  Your diastolic blood pressure is 80 or higher. Take medicines only as told by your health care provider. Follow the directions carefully. Blood pressure medicines must be taken as prescribed. The medicine does not work as well when you skip doses.  Skipping doses also puts you at risk for problems. Contact a health care provider if:  You think you are having a reaction to medicines you have taken.  You have repeated (recurrent) headaches.  You feel dizzy.  You have swelling in your ankles.  You have trouble with your vision. Get help right away if:  You develop a severe headache or confusion.  You have unusual weakness or numbness, or you feel faint.  You have severe pain in your chest or abdomen.  You vomit repeatedly.  You have trouble breathing. Summary  Hypertension is when the force of blood pumping through your arteries is too strong. If this condition is not controlled, it may put you at risk for serious complications.  Your personal target blood pressure may vary depending on your medical conditions, your age, and other factors. For most people, a normal blood pressure is less than 120/80.  Hypertension is managed by lifestyle changes, medicines, or both. Lifestyle changes include weight loss, eating a healthy, low-sodium diet, exercising more, and limiting alcohol. This information is not intended to replace advice given to you by your health care provider. Make sure you discuss any questions you have with your health care provider. Document Revised: 01/08/2019 Document Reviewed: 08/14/2016 Elsevier Patient Education  2020 Elsevier Inc.  

## 2020-03-29 NOTE — Assessment & Plan Note (Signed)
Elevated. Will adjunct with HCTZ.He will monitor at home and stop mucinex-D, he will take plain mucinex.

## 2020-03-29 NOTE — Assessment & Plan Note (Signed)
Stable, continue, pending lipid panel to see if LDL < 70

## 2020-03-29 NOTE — Progress Notes (Signed)
Subjective:    Patient ID: Thomas Brandt, male    DOB: Nov 26, 1960, 59 y.o.   MRN: [id number]  CC: [name] is a 59 y.o. male who presents today for follow up.   HPI: Feels well today.  NO complaints.   HTN- compliant with lotrel. Doesn't check BP at home. No CP.   Takes mucinex D daily to help with chronic congestion.  No NSAIDs.   Decreasing cigarette use, 5/ day. Smoke a cigarette on way here.   COPD- breathing well. No SOB, cough.  HLD -compliant with medication  DM- compliant with medication   declines lung cancer screening  Declines covid 19 vaccine due to concern with bells palsy Declines colonoscopy   COPD- established with Dr Jayme Cloud.  Eye exam due.   HISTORY:  Past Medical History:  Diagnosis Date  . Hyperlipidemia   . Hypertension   . Stroke [facility]) 2018   History reviewed. No pertinent surgical history. Family History  Problem Relation Age of Onset  . Heart disease Father     Allergies: Patient has no known allergies. Current Outpatient Medications on File Prior to Visit  Medication Sig Dispense Refill  . amLODipine-benazepril (LOTREL) 10-20 MG capsule Take 1 capsule by mouth at bedtime. 90 capsule 1  . aspirin 81 MG chewable tablet Chew by mouth daily.    . cetirizine (ZYRTEC) 10 MG tablet Take 1 tablet (10 mg total) by mouth daily. 30 tablet 11  . cholecalciferol (VITAMIN D) 400 units TABS tablet Take 1,000 Units by mouth.    . metFORMIN (GLUCOPHAGE) 1000 MG tablet Take 1 tablet (1,000 mg total) by mouth 2 (two) times daily with a meal. 180 tablet 3  . montelukast (SINGULAIR) 10 MG tablet TAKE 1 TABLET BY MOUTH EVERYDAY AT BEDTIME 90 tablet 1  . rosuvastatin (CRESTOR) 10 MG tablet Take 1 tablet (10 mg total) by mouth daily. 90 tablet 3  . sitaGLIPtin (JANUVIA) 25 MG tablet Take 1 tablet (25 mg total) by mouth daily. 90 tablet 1  . SYMBICORT 160-4.5 MCG/ACT inhaler TAKE 2 PUFFS BY MOUTH TWICE A DAY 30 Inhaler 2  . Tiotropium Bromide  Monohydrate (SPIRIVA RESPIMAT) 2.5 MCG/ACT AERS Inhale 2 puffs into the lungs daily. 4 g 0  . Tiotropium Bromide Monohydrate (SPIRIVA RESPIMAT) 2.5 MCG/ACT AERS Inhale 2 puffs into the lungs daily. 4 g 11  . vitamin B-12 (CYANOCOBALAMIN) 500 MCG tablet TAKE 1 TABLET BY MOUTH DAILY 100 tablet 0   Current Facility-Administered Medications on File Prior to Visit  Medication Dose Route Frequency Provider Last Rate Last Admin  . albuterol (PROVENTIL) (2.5 MG/3ML) 0.083% nebulizer solution 2.5 mg  2.5 mg Nebulization Once Salena Saner, MD        Social History   Tobacco Use  . Smoking status: Current Some Day Smoker    Packs/day: 0.75    Years: 39.00    Pack years: 29.25    Types: Cigarettes    Start date: 02/29/1980  . Smokeless tobacco: Never Used  . Tobacco comment: 3-4 cigarettes a day currently  Vaping Use  . Vaping Use: Never used  Substance Use Topics  . Alcohol use: Not on file  . Drug use: Not on file    Review of Systems  Constitutional: Negative for chills and fever.  HENT: Positive for congestion.   Respiratory: Negative for cough, shortness of breath and wheezing.   Cardiovascular: Negative for chest pain and palpitations.  Gastrointestinal: Negative for nausea and vomiting.  Objective:    BP (!) 150/70 (BP Location: Left Arm, Patient Position: Sitting)   Pulse 88   Temp 98.3 F (36.8 C)   Ht 6' 0.01" (1.829 m)   Wt 226 lb 3.2 oz (102.6 kg)   SpO2 96%   BMI 30.67 kg/m  BP Readings from Last 3 Encounters:  03/29/20 (!) 150/70  02/29/20 (!) 158/70  12/27/19 (!) 150/68   Wt Readings from Last 3 Encounters:  03/29/20 226 lb 3.2 oz (102.6 kg)  02/29/20 227 lb 12.8 oz (103.3 kg)  12/27/19 226 lb 12.8 oz (102.9 kg)    Physical Exam Vitals reviewed.  Constitutional:      Appearance: He is well-developed.  Cardiovascular:     Rate and Rhythm: Regular rhythm.     Heart sounds: Normal heart sounds.  Pulmonary:     Effort: Pulmonary effort is  normal. No respiratory distress.     Breath sounds: Normal breath sounds. No wheezing, rhonchi or rales.  Skin:    General: Skin is warm and dry.  Neurological:     Mental Status: He is alert.  Psychiatric:        Speech: Speech normal.        Behavior: Behavior normal.        Assessment & Plan:   Problem List Items Addressed This Visit      Cardiovascular and Mediastinum   Essential hypertension - Primary    Elevated. Will adjunct with HCTZ.He will monitor at home and stop mucinex-D, he will take plain mucinex.       Relevant Medications   hydrochlorothiazide (MICROZIDE) 12.5 MG capsule   Other Relevant Orders   Comprehensive metabolic panel   Microalbumin / creatinine urine ratio     Endocrine   Type 2 diabetes mellitus without complication, without long-term current use of insulin (HCC)    Pending a1c      Relevant Orders   Hemoglobin A1c     Other   Mixed hyperlipidemia    Stable, continue, pending lipid panel to see if LDL < 70      Relevant Medications   hydrochlorothiazide (MICROZIDE) 12.5 MG capsule   Other Relevant Orders   Lipid panel    Other Visit Diagnoses    Need for hepatitis C screening test       Relevant Orders   Hepatitis C antibody       I am having Lucky Rathke start on hydrochlorothiazide. I am also having him maintain his cholecalciferol, aspirin, montelukast, cetirizine, sitaGLIPtin, rosuvastatin, amLODipine-benazepril, vitamin B-12, Spiriva Respimat, Spiriva Respimat, metFORMIN, and Symbicort.   Meds ordered this encounter  Medications  . hydrochlorothiazide (MICROZIDE) 12.5 MG capsule    Sig: Take 1 capsule (12.5 mg total) by mouth daily.    Dispense:  90 capsule    Refill:  1    Order Specific Question:   Supervising Provider    Answer:   Sherlene Shams [[id number]]    Return precautions given.   Risks, benefits, and alternatives of the medications and treatment plan prescribed today were discussed, and patient expressed  understanding.   Education regarding symptom management and diagnosis given to patient on AVS.  Continue to follow with Allegra Grana, FNP for routine health maintenance.   Lucky Rathke and I agreed with plan.   Rennie Plowman, FNP

## 2020-04-17 ENCOUNTER — Other Ambulatory Visit (INDEPENDENT_AMBULATORY_CARE_PROVIDER_SITE_OTHER): Payer: 59

## 2020-04-17 ENCOUNTER — Other Ambulatory Visit: Payer: Self-pay

## 2020-04-17 DIAGNOSIS — E119 Type 2 diabetes mellitus without complications: Secondary | ICD-10-CM | POA: Diagnosis not present

## 2020-04-17 DIAGNOSIS — Z1159 Encounter for screening for other viral diseases: Secondary | ICD-10-CM

## 2020-04-17 DIAGNOSIS — I10 Essential (primary) hypertension: Secondary | ICD-10-CM | POA: Diagnosis not present

## 2020-04-17 DIAGNOSIS — E782 Mixed hyperlipidemia: Secondary | ICD-10-CM

## 2020-04-17 LAB — COMPREHENSIVE METABOLIC PANEL
ALT: 36 U/L (ref 0–53)
AST: 26 U/L (ref 0–37)
Albumin: 4.2 g/dL (ref 3.5–5.2)
Alkaline Phosphatase: 66 U/L (ref 39–117)
BUN: 16 mg/dL (ref 6–23)
CO2: 30 mEq/L (ref 19–32)
Calcium: 9.2 mg/dL (ref 8.4–10.5)
Chloride: 100 mEq/L (ref 96–112)
Creatinine, Ser: 0.71 mg/dL (ref 0.40–1.50)
GFR: 113.71 mL/min (ref >60.00)
Glucose, Bld: 150 mg/dL — ABNORMAL HIGH (ref 70–99)
Potassium: 4.1 mEq/L (ref 3.5–5.1)
Sodium: 140 mEq/L (ref 135–145)
Total Bilirubin: 0.7 mg/dL (ref 0.2–1.2)
Total Protein: 6.9 g/dL (ref 6.0–8.3)

## 2020-04-17 LAB — LIPID PANEL
Cholesterol: 155 mg/dL (ref 0–200)
HDL: 43.3 mg/dL (ref >39.00)
NonHDL: 112.09
Total CHOL/HDL Ratio: 4
Triglycerides: 295 mg/dL — ABNORMAL HIGH (ref 0.0–149.0)
VLDL: 59 mg/dL — ABNORMAL HIGH (ref 0.0–40.0)

## 2020-04-17 LAB — MICROALBUMIN / CREATININE URINE RATIO
Creatinine,U: 93.1 mg/dL
Microalb Creat Ratio: 0.8 mg/g (ref 0.0–30.0)
Microalb, Ur: <0.7 mg/dL (ref 0.0–1.9)

## 2020-04-17 LAB — LDL CHOLESTEROL, DIRECT: Direct LDL: 74 mg/dL

## 2020-04-17 LAB — HEMOGLOBIN A1C: Hgb A1c MFr Bld: 7.2 % — ABNORMAL HIGH (ref 4.6–6.5)

## 2020-04-18 LAB — HEPATITIS C ANTIBODY
Hepatitis C Ab: NONREACTIVE
SIGNAL TO CUT-OFF: 0.01 (ref <1.00)

## 2020-04-21 ENCOUNTER — Other Ambulatory Visit: Payer: Self-pay

## 2020-04-21 DIAGNOSIS — J449 Chronic obstructive pulmonary disease, unspecified: Secondary | ICD-10-CM

## 2020-04-21 DIAGNOSIS — E782 Mixed hyperlipidemia: Secondary | ICD-10-CM

## 2020-04-21 DIAGNOSIS — E119 Type 2 diabetes mellitus without complications: Secondary | ICD-10-CM

## 2020-04-21 MED ORDER — SITAGLIPTIN PHOSPHATE 25 MG PO TABS: 50.0000 mg | ORAL_TABLET | Freq: Every day | ORAL | 1 refills | Status: DC

## 2020-04-21 MED ORDER — BUDESONIDE-FORMOTEROL FUMARATE 160-4.5 MCG/ACT IN AERO: INHALATION_SPRAY | RESPIRATORY_TRACT | 2 refills | Status: DC

## 2020-04-21 MED ORDER — ROSUVASTATIN CALCIUM 10 MG PO TABS: 20.0000 mg | ORAL_TABLET | Freq: Every day | ORAL | 1 refills | Status: DC

## 2020-04-30 ENCOUNTER — Other Ambulatory Visit: Payer: Self-pay | Admitting: Family

## 2020-04-30 DIAGNOSIS — E119 Type 2 diabetes mellitus without complications: Secondary | ICD-10-CM

## 2020-05-09 ENCOUNTER — Other Ambulatory Visit: Payer: Self-pay | Admitting: Family

## 2020-05-09 DIAGNOSIS — E538 Deficiency of other specified B group vitamins: Secondary | ICD-10-CM

## 2020-05-17 ENCOUNTER — Telehealth: Payer: Self-pay

## 2020-05-17 NOTE — Telephone Encounter (Signed)
Received referral for lung CT screening program.  Patient was contacted in April 2021 and was to call program back.  Patient was re contacted today and says he is not interested in participating in the program.  I thanked patient for his time and consideration of the program.

## 2020-05-26 ENCOUNTER — Other Ambulatory Visit: Payer: Self-pay

## 2020-05-26 MED ORDER — SPIRIVA RESPIMAT 2.5 MCG/ACT IN AERS: 2.0000 | INHALATION_SPRAY | Freq: Every day | RESPIRATORY_TRACT | 3 refills | Status: DC

## 2020-05-26 NOTE — Telephone Encounter (Signed)
90 day supply of spiriva 2.5 has been sent to [facility] as requested by pharmacy.

## 2020-05-29 ENCOUNTER — Ambulatory Visit: Payer: 59 | Admitting: Pulmonary Disease

## 2020-05-31 ENCOUNTER — Encounter: Payer: Self-pay | Admitting: Family

## 2020-05-31 ENCOUNTER — Telehealth (INDEPENDENT_AMBULATORY_CARE_PROVIDER_SITE_OTHER): Payer: 59 | Admitting: Family

## 2020-05-31 VITALS — Ht 72.0 in | Wt 225.0 lb

## 2020-05-31 DIAGNOSIS — E119 Type 2 diabetes mellitus without complications: Secondary | ICD-10-CM | POA: Diagnosis not present

## 2020-05-31 DIAGNOSIS — E538 Deficiency of other specified B group vitamins: Secondary | ICD-10-CM

## 2020-05-31 DIAGNOSIS — E782 Mixed hyperlipidemia: Secondary | ICD-10-CM

## 2020-05-31 DIAGNOSIS — J449 Chronic obstructive pulmonary disease, unspecified: Secondary | ICD-10-CM

## 2020-05-31 DIAGNOSIS — Z114 Encounter for screening for human immunodeficiency virus [HIV]: Secondary | ICD-10-CM | POA: Diagnosis not present

## 2020-05-31 DIAGNOSIS — I10 Essential (primary) hypertension: Secondary | ICD-10-CM

## 2020-05-31 MED ORDER — SITAGLIPTIN PHOSPHATE 50 MG PO TABS: 50.0000 mg | ORAL_TABLET | Freq: Every day | ORAL | 1 refills | Status: DC

## 2020-05-31 MED ORDER — VITAMIN B-12 500 MCG PO TABS: 500.0000 ug | ORAL_TABLET | Freq: Every day | ORAL | 1 refills | Status: DC

## 2020-05-31 MED ORDER — ROSUVASTATIN CALCIUM 20 MG PO TABS: 20.0000 mg | ORAL_TABLET | Freq: Every day | ORAL | 1 refills | Status: DC

## 2020-05-31 NOTE — Assessment & Plan Note (Signed)
Anticipate improved. Pending CMP.

## 2020-05-31 NOTE — Patient Instructions (Addendum)
Very important for you to have your lab checked within the next one week  It is imperative that you are seen AT least twice per year for labs and monitoring. Monitor blood pressure at home and me 5-6 reading on separate days. Goal is less than 120/80, based on newest guidelines, however we certainly want to be less than 130/80;  if persistently higher, please make sooner follow up appointment so we can recheck you blood pressure and manage/ adjust medications.  Please consider covid vaccine as we discussed today.  Stay safe!    Managing Your Hypertension Hypertension is commonly called high blood pressure. This is when the force of your blood pressing against the walls of your arteries is too strong. Arteries are blood vessels that carry blood from your heart throughout your body. Hypertension forces the heart to work harder to pump blood, and may cause the arteries to become narrow or stiff. Having untreated or uncontrolled hypertension can cause heart attack, stroke, kidney disease, and other problems. What are blood pressure readings? A blood pressure reading consists of a higher number over a lower number. Ideally, your blood pressure should be below 120/80. The first ("top") number is called the systolic pressure. It is a measure of the pressure in your arteries as your heart beats. The second ("bottom") number is called the diastolic pressure. It is a measure of the pressure in your arteries as the heart relaxes. What does my blood pressure reading mean? Blood pressure is classified into four stages. Based on your blood pressure reading, your health care provider may use the following stages to determine what type of treatment you need, if any. Systolic pressure and diastolic pressure are measured in a unit called mm Hg. Normal  Systolic pressure: below 120.  Diastolic pressure: below 80. Elevated  Systolic pressure: 120-129.  Diastolic pressure: below 80. Hypertension stage  1  Systolic pressure: 130-139.  Diastolic pressure: 80-89. Hypertension stage 2  Systolic pressure: 140 or above.  Diastolic pressure: 90 or above. What health risks are associated with hypertension? Managing your hypertension is an important responsibility. Uncontrolled hypertension can lead to:  A heart attack.  A stroke.  A weakened blood vessel (aneurysm).  Heart failure.  Kidney damage.  Eye damage.  Metabolic syndrome.  Memory and concentration problems. What changes can I make to manage my hypertension? Hypertension can be managed by making lifestyle changes and possibly by taking medicines. Your health care provider will help you make a plan to bring your blood pressure within a normal range. Eating and drinking   Eat a diet that is high in fiber and potassium, and low in salt (sodium), added sugar, and fat. An example eating plan is called the DASH (Dietary Approaches to Stop Hypertension) diet. To eat this way: ? Eat plenty of fresh fruits and vegetables. Try to fill half of your plate at each meal with fruits and vegetables. ? Eat whole grains, such as whole wheat pasta, brown rice, or whole grain bread. Fill about one quarter of your plate with whole grains. ? Eat low-fat diary products. ? Avoid fatty cuts of meat, processed or cured meats, and poultry with skin. Fill about one quarter of your plate with lean proteins such as fish, chicken without skin, beans, eggs, and tofu. ? Avoid premade and processed foods. These tend to be higher in sodium, added sugar, and fat.  Reduce your daily sodium intake. Most people with hypertension should eat less than 1,500 mg of sodium a day.  Limit alcohol intake to no more than 1 drink a day for nonpregnant women and 2 drinks a day for men. One drink equals 12 oz of beer, 5 oz of wine, or 1 oz of hard liquor. Lifestyle  Work with your health care provider to maintain a healthy body weight, or to lose weight. Ask what an  ideal weight is for you.  Get at least 30 minutes of exercise that causes your heart to beat faster (aerobic exercise) most days of the week. Activities may include walking, swimming, or biking.  Include exercise to strengthen your muscles (resistance exercise), such as weight lifting, as part of your weekly exercise routine. Try to do these types of exercises for 30 minutes at least 3 days a week.  Do not use any products that contain nicotine or tobacco, such as cigarettes and e-cigarettes. If you need help quitting, ask your health care provider.  Control any long-term (chronic) conditions you have, such as high cholesterol or diabetes. Monitoring  Monitor your blood pressure at home as told by your health care provider. Your personal target blood pressure may vary depending on your medical conditions, your age, and other factors.  Have your blood pressure checked regularly, as often as told by your health care provider. Working with your health care provider  Review all the medicines you take with your health care provider because there may be side effects or interactions.  Talk with your health care provider about your diet, exercise habits, and other lifestyle factors that may be contributing to hypertension.  Visit your health care provider regularly. Your health care provider can help you create and adjust your plan for managing hypertension. Will I need medicine to control my blood pressure? Your health care provider may prescribe medicine if lifestyle changes are not enough to get your blood pressure under control, and if:  Your systolic blood pressure is 130 or higher.  Your diastolic blood pressure is 80 or higher. Take medicines only as told by your health care provider. Follow the directions carefully. Blood pressure medicines must be taken as prescribed. The medicine does not work as well when you skip doses. Skipping doses also puts you at risk for problems. Contact a  health care provider if:  You think you are having a reaction to medicines you have taken.  You have repeated (recurrent) headaches.  You feel dizzy.  You have swelling in your ankles.  You have trouble with your vision. Get help right away if:  You develop a severe headache or confusion.  You have unusual weakness or numbness, or you feel faint.  You have severe pain in your chest or abdomen.  You vomit repeatedly.  You have trouble breathing. Summary  Hypertension is when the force of blood pumping through your arteries is too strong. If this condition is not controlled, it may put you at risk for serious complications.  Your personal target blood pressure may vary depending on your medical conditions, your age, and other factors. For most people, a normal blood pressure is less than 120/80.  Hypertension is managed by lifestyle changes, medicines, or both. Lifestyle changes include weight loss, eating a healthy, low-sodium diet, exercising more, and limiting alcohol. This information is not intended to replace advice given to you by your health care provider. Make sure you discuss any questions you have with your health care provider. Document Revised: 01/08/2019 Document Reviewed: 08/14/2016 Elsevier Patient Education  2020 [employer].

## 2020-05-31 NOTE — Assessment & Plan Note (Signed)
Stable. Continue regimen. 

## 2020-05-31 NOTE — Assessment & Plan Note (Signed)
Improved. Advised to check more often at home and to follow up in the office. Education provided and mailed on avs in regards to goal of blood pressure.

## 2020-05-31 NOTE — Assessment & Plan Note (Addendum)
Suspect improved. Continue januvia 50mg . Advised importance of eye exam

## 2020-05-31 NOTE — Progress Notes (Signed)
Virtual Visit via Video Note  I connected with@  on 05/31/20 at  8:00 AM EDT by a video enabled telemedicine application and verified that I am speaking with the correct person using two identifiers.  Location patient: home Location provider:work Persons participating in the virtual visit: patient, provider  I discussed the limitations of evaluation and management by telemedicine and the availability of in person appointments. The patient expressed understanding and agreed to proceed.  Problem List Items Addressed This Visit      Cardiovascular and Mediastinum   Essential hypertension    Improved. Advised to check more often at home and to follow up in the office. Education provided and mailed on avs in regards to goal of blood pressure.      Relevant Medications   rosuvastatin (CRESTOR) 20 MG tablet     Respiratory   Chronic obstructive pulmonary disease (HCC)    Stable. Continue regimen.        Endocrine   Type 2 diabetes mellitus without complication, without long-term current use of insulin (HCC)    Suspect improved. Continue januvia 50mg . Advised importance of eye exam      Relevant Medications   sitaGLIPtin (JANUVIA) 50 MG tablet   rosuvastatin (CRESTOR) 20 MG tablet     Other   Mixed hyperlipidemia    Anticipate improved. Pending CMP.      Relevant Medications   rosuvastatin (CRESTOR) 20 MG tablet    Other Visit Diagnoses    Screening for HIV (human immunodeficiency virus)    -  Primary   Relevant Orders   HIV Antibody (routine testing w rflx)   B12 deficiency       Relevant Medications   vitamin B-12 (CYANOCOBALAMIN) 500 MCG tablet       HPI: Feels well today No complaints  HTN- hasnt checked blood pressure at home. Feels better since HCTZ started. 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 Denies exertional chest pain or pressure, numbness or tingling radiating to left arm or jaw, palpitations, dizziness, frequent headaches, changes in vision.  DM- increased  januvia  HLD- increased crestor to 20mg   COPD- not cough, sob. Doing well on symbicort.   Labs due today  Due eye exam, colonoscopy  ROS: See pertinent positives and negatives per HPI.  Past Medical History:  Diagnosis Date  . Bell's palsy    59 years old  . Hyperlipidemia   . Hypertension   . Stroke [facility]) 2018    History reviewed. No pertinent surgical history.  Family History  Problem Relation Age of Onset  . Heart disease Father        Current Outpatient Medications:  .  amLODipine-benazepril (LOTREL) 10-20 MG capsule, Take 1 capsule by mouth at bedtime., Disp: 90 capsule, Rfl: 1 .  aspirin 81 MG chewable tablet, Chew by mouth daily., Disp: , Rfl:  .  budesonide-formoterol (SYMBICORT) 160-4.5 MCG/ACT inhaler, TAKE 2 PUFFS BY MOUTH TWICE A DAY, Disp: 30 Inhaler, Rfl: 2 .  cetirizine (ZYRTEC) 10 MG tablet, Take 1 tablet (10 mg total) by mouth daily., Disp: 30 tablet, Rfl: 11 .  hydrochlorothiazide (MICROZIDE) 12.5 MG capsule, Take 1 capsule (12.5 mg total) by mouth daily., Disp: 90 capsule, Rfl: 1 .  metFORMIN (GLUCOPHAGE) 1000 MG tablet, Take 1 tablet (1,000 mg total) by mouth 2 (two) times daily with a meal., Disp: 180 tablet, Rfl: 3 .  montelukast (SINGULAIR) 10 MG tablet, TAKE 1 TABLET BY MOUTH EVERYDAY AT BEDTIME, Disp: 90 tablet, Rfl: 1 .  rosuvastatin (CRESTOR) 20  MG tablet, Take 1 tablet (20 mg total) by mouth daily., Disp: 90 tablet, Rfl: 1 .  sitaGLIPtin (JANUVIA) 50 MG tablet, Take 1 tablet (50 mg total) by mouth daily., Disp: 90 tablet, Rfl: 1 .  Tiotropium Bromide Monohydrate (SPIRIVA RESPIMAT) 2.5 MCG/ACT AERS, Inhale 2 puffs into the lungs daily., Disp: 4 g, Rfl: 0 .  vitamin B-12 (CYANOCOBALAMIN) 500 MCG tablet, Take 1 tablet (500 mcg total) by mouth daily., Disp: 90 tablet, Rfl: 1 .  cholecalciferol (VITAMIN D) 400 units TABS tablet, Take 1,000 Units by mouth. (Patient not taking: Reported on 05/31/2020), Disp: , Rfl:  No current facility-administered  medications for this visit.  Facility-Administered Medications Ordered in Other Visits:  .  albuterol (PROVENTIL) (2.5 MG/3ML) 0.083% nebulizer solution 2.5 mg, 2.5 mg, Nebulization, Once, Salena Saner, MD  EXAM:  VITALS per patient if applicable: BP Readings from Last 3 Encounters:  03/29/20 (!) 150/70  02/29/20 (!) 158/70  12/27/19 (!) 150/68     PSYCH/NEURO: pleasant and cooperative, no obvious depression or anxiety, speech and thought processing grossly intact  ASSESSMENT AND PLAN:  Discussed the following assessment and plan:  Screening for HIV (human immunodeficiency virus) - Plan: HIV Antibody (routine testing w rflx)  Type 2 diabetes mellitus without complication, without long-term current use of insulin (HCC) - Plan: sitaGLIPtin (JANUVIA) 50 MG tablet  B12 deficiency - Plan: vitamin B-12 (CYANOCOBALAMIN) 500 MCG tablet  Mixed hyperlipidemia - Plan: rosuvastatin (CRESTOR) 20 MG tablet  Essential hypertension  Chronic obstructive pulmonary disease, unspecified COPD type (HCC)  Problem List Items Addressed This Visit      Cardiovascular and Mediastinum   Essential hypertension    Improved. Advised to check more often at home and to follow up in the office. Education provided and mailed on avs in regards to goal of blood pressure.      Relevant Medications   rosuvastatin (CRESTOR) 20 MG tablet     Respiratory   Chronic obstructive pulmonary disease (HCC)    Stable. Continue regimen.        Endocrine   Type 2 diabetes mellitus without complication, without long-term current use of insulin (HCC)    Suspect improved. Continue januvia 50mg . Advised importance of eye exam      Relevant Medications   sitaGLIPtin (JANUVIA) 50 MG tablet   rosuvastatin (CRESTOR) 20 MG tablet     Other   Mixed hyperlipidemia    Anticipate improved. Pending CMP.      Relevant Medications   rosuvastatin (CRESTOR) 20 MG tablet    Other Visit Diagnoses    Screening for  HIV (human immunodeficiency virus)    -  Primary   Relevant Orders   HIV Antibody (routine testing w rflx)   B12 deficiency       Relevant Medications   vitamin B-12 (CYANOCOBALAMIN) 500 MCG tablet    Of note, declines colonoscopy screening  -we discussed possible serious and likely etiologies, options for evaluation and workup, limitations of telemedicine visit vs in person visit, treatment, treatment risks and precautions. Pt prefers to treat via telemedicine empirically rather then risking or undertaking an in person visit at this moment.   I discussed the assessment and treatment plan with the patient. The patient was provided an opportunity to ask questions and all were answered. The patient agreed with the plan and demonstrated an understanding of the instructions.   The patient was advised to call back or seek an in-person evaluation if the symptoms worsen or if the  condition fails to improve as anticipated.   Rennie Plowman, FNP   I have spent 22 minutes with a patient including precharting, exam, reviewing labs, and discussion plan of care.

## 2020-06-09 ENCOUNTER — Other Ambulatory Visit (INDEPENDENT_AMBULATORY_CARE_PROVIDER_SITE_OTHER): Payer: 59

## 2020-06-09 ENCOUNTER — Other Ambulatory Visit: Payer: Self-pay | Admitting: Family

## 2020-06-09 ENCOUNTER — Other Ambulatory Visit: Payer: Self-pay

## 2020-06-09 DIAGNOSIS — Z114 Encounter for screening for human immunodeficiency virus [HIV]: Secondary | ICD-10-CM

## 2020-06-09 DIAGNOSIS — I10 Essential (primary) hypertension: Secondary | ICD-10-CM

## 2020-06-09 DIAGNOSIS — E782 Mixed hyperlipidemia: Secondary | ICD-10-CM

## 2020-06-09 LAB — COMPREHENSIVE METABOLIC PANEL
ALT: 39 U/L (ref 0–53)
AST: 27 U/L (ref 0–37)
Albumin: 4.2 g/dL (ref 3.5–5.2)
Alkaline Phosphatase: 71 U/L (ref 39–117)
BUN: 12 mg/dL (ref 6–23)
CO2: 30 mEq/L (ref 19–32)
Calcium: 9.2 mg/dL (ref 8.4–10.5)
Chloride: 99 mEq/L (ref 96–112)
Creatinine, Ser: 0.74 mg/dL (ref 0.40–1.50)
GFR: 108.35 mL/min (ref >60.00)
Glucose, Bld: 187 mg/dL — ABNORMAL HIGH (ref 70–99)
Potassium: 3.3 mEq/L — ABNORMAL LOW (ref 3.5–5.1)
Sodium: 139 mEq/L (ref 135–145)
Total Bilirubin: 0.5 mg/dL (ref 0.2–1.2)
Total Protein: 6.8 g/dL (ref 6.0–8.3)

## 2020-06-09 MED ORDER — POTASSIUM CHLORIDE ER 10 MEQ PO TBCR: 10.0000 meq | EXTENDED_RELEASE_TABLET | ORAL | 1 refills | Status: DC

## 2020-06-10 LAB — HIV ANTIBODY (ROUTINE TESTING W REFLEX): HIV 1&2 Ab, 4th Generation: NONREACTIVE

## 2020-06-20 ENCOUNTER — Other Ambulatory Visit: Payer: Self-pay | Admitting: Family

## 2020-06-20 ENCOUNTER — Telehealth: Payer: Self-pay | Admitting: *Deleted

## 2020-06-20 DIAGNOSIS — I10 Essential (primary) hypertension: Secondary | ICD-10-CM

## 2020-06-20 NOTE — Telephone Encounter (Signed)
Please place future orders for lab appt.  

## 2020-06-22 ENCOUNTER — Other Ambulatory Visit (INDEPENDENT_AMBULATORY_CARE_PROVIDER_SITE_OTHER): Payer: 59

## 2020-06-22 ENCOUNTER — Other Ambulatory Visit: Payer: Self-pay

## 2020-06-22 DIAGNOSIS — I10 Essential (primary) hypertension: Secondary | ICD-10-CM

## 2020-06-23 ENCOUNTER — Other Ambulatory Visit: Payer: Self-pay | Admitting: Family

## 2020-06-23 DIAGNOSIS — I10 Essential (primary) hypertension: Secondary | ICD-10-CM

## 2020-06-23 LAB — BASIC METABOLIC PANEL
BUN: 19 mg/dL (ref 6–23)
CO2: 30 mEq/L (ref 19–32)
Calcium: 10 mg/dL (ref 8.4–10.5)
Chloride: 97 mEq/L (ref 96–112)
Creatinine, Ser: 0.87 mg/dL (ref 0.40–1.50)
GFR: 89.88 mL/min (ref >60.00)
Glucose, Bld: 158 mg/dL — ABNORMAL HIGH (ref 70–99)
Potassium: 4.7 mEq/L (ref 3.5–5.1)
Sodium: 137 mEq/L (ref 135–145)

## 2020-07-23 ENCOUNTER — Other Ambulatory Visit: Payer: Self-pay | Admitting: Family

## 2020-07-28 ENCOUNTER — Telehealth: Payer: Self-pay | Admitting: Family

## 2020-07-28 NOTE — Telephone Encounter (Signed)
Pt declined to have CT lung screening.

## 2020-07-31 NOTE — Telephone Encounter (Signed)
noted 

## 2020-08-10 ENCOUNTER — Ambulatory Visit: Payer: 59 | Admitting: Pulmonary Disease

## 2020-08-14 ENCOUNTER — Other Ambulatory Visit: Payer: Self-pay | Admitting: Family

## 2020-08-30 ENCOUNTER — Ambulatory Visit: Payer: 59 | Admitting: Family

## 2020-10-02 ENCOUNTER — Other Ambulatory Visit: Payer: 59

## 2020-10-02 ENCOUNTER — Encounter: Payer: Self-pay | Admitting: Emergency Medicine

## 2020-10-02 ENCOUNTER — Ambulatory Visit (INDEPENDENT_AMBULATORY_CARE_PROVIDER_SITE_OTHER): Payer: 59

## 2020-10-02 ENCOUNTER — Other Ambulatory Visit: Payer: Self-pay

## 2020-10-02 ENCOUNTER — Ambulatory Visit
Admission: EM | Admit: 2020-10-02 | Discharge: 2020-10-02 | Disposition: A | Discharge status: Home / Self Care | Payer: 59 | Attending: Family Medicine | Admitting: Family Medicine

## 2020-10-02 ENCOUNTER — Ambulatory Visit (HOSPITAL_COMMUNITY): Payer: 59

## 2020-10-02 DIAGNOSIS — M25572 Pain in left ankle and joints of left foot: Secondary | ICD-10-CM

## 2020-10-02 DIAGNOSIS — L03116 Cellulitis of left lower limb: Secondary | ICD-10-CM

## 2020-10-02 DIAGNOSIS — M25472 Effusion, left ankle: Secondary | ICD-10-CM

## 2020-10-02 MED ORDER — SULFAMETHOXAZOLE-TRIMETHOPRIM 800-160 MG PO TABS: 1.0000 | ORAL_TABLET | Freq: Two times a day (BID) | ORAL | 0 refills | Status: AC

## 2020-10-02 MED ORDER — FUROSEMIDE 20 MG PO TABS: 20.0000 mg | ORAL_TABLET | Freq: Every day | ORAL | 0 refills | Status: DC

## 2020-10-02 NOTE — ED Triage Notes (Signed)
Pt tripped over a garden hose a couple of days ago.  LT ankle is swollen and painful.

## 2020-10-02 NOTE — Discharge Instructions (Signed)
I have sent in lasix for you to take for the next 5 days   I have sent in Bactrim as an antibiotic to take twice a day for 7 days  Xray was negative for any fracture or dislocation  Follow up with this office or with primary care if symptoms are persisting.  Follow up in the ER for high fever, trouble swallowing, trouble breathing, other concerning symptoms.

## 2020-10-02 NOTE — ED Provider Notes (Signed)
[facility]    CSN: [id number] Arrival date & time: 10/02/20  1655      History   Chief Complaint Chief Complaint  Patient presents with  . Ankle Injury    HPI Thomas Brandt is a 60 y.o. male.   Reports L ankle swelling and pain over the last 2-3 days. Reports that he tripped over a garden hose at home and may have twisted the ankle. Pain is worse with activity. Has soaked in epsom salts and elevated the ankle with some reduction in swelling. Denies similar symptoms in the past. Has not taken anything for pain. Denies headache, cough, SOB, nausea, vomiting, diarrhea, rash, fever, other symptoms.  ROS per HPI  The history is provided by the patient.    Past Medical History:  Diagnosis Date  . Bell's palsy    60 years old  . Hyperlipidemia   . Hypertension   . Stroke [facility] [place]) 2018    Patient Active Problem List   Diagnosis Date Noted  . Seasonal allergic rhinitis due to pollen 02/08/2019  . Essential hypertension 11/03/2018  . Type 2 diabetes mellitus without complication, without long-term current use of insulin (HCC) 11/03/2018  . Chronic obstructive pulmonary disease (HCC) 11/03/2018  . Mixed hyperlipidemia 11/03/2018  . History of CVA (cerebrovascular accident) 11/03/2018  . Ischemic stroke (HCC) 09/24/2017  . Tobacco use disorder 09/24/2017    History reviewed. No pertinent surgical history.     Home Medications    Prior to Admission medications   Medication Sig Start Date End Date Taking? Authorizing Provider  furosemide (LASIX) 20 MG tablet Take 1 tablet (20 mg total) by mouth daily for 15 days. 10/02/20 10/17/20 Yes Moshe Cipro, NP  sulfamethoxazole-trimethoprim (BACTRIM DS) 800-160 MG tablet Take 1 tablet by mouth 2 (two) times daily for 7 days. 10/02/20 10/09/20 Yes Moshe Cipro, NP  amLODipine-benazepril (LOTREL) 10-20 MG capsule TAKE 1 CAPSULE BY MOUTH EVERYDAY AT BEDTIME 06/23/20   Allegra Grana, FNP  aspirin 81 MG chewable  tablet Chew by mouth daily.    [provider]  budesonide-formoterol (SYMBICORT) 160-4.5 MCG/ACT inhaler TAKE 2 PUFFS BY MOUTH TWICE A DAY 04/21/20   Allegra Grana, FNP  cetirizine (ZYRTEC) 10 MG tablet TAKE 1 TABLET BY MOUTH EVERY DAY 08/15/20   Allegra Grana, FNP  cholecalciferol (VITAMIN D) 400 units TABS tablet Take 1,000 Units by mouth. Patient not taking: Reported on 05/31/2020    [provider]  hydrochlorothiazide (MICROZIDE) 12.5 MG capsule TAKE 1 CAPSULE BY MOUTH EVERY DAY 07/23/20   Allegra Grana, FNP  metFORMIN (GLUCOPHAGE) 1000 MG tablet Take 1 tablet (1,000 mg total) by mouth 2 (two) times daily with a meal. 03/02/20   Arnett, Lyn Records, FNP  montelukast (SINGULAIR) 10 MG tablet TAKE 1 TABLET BY MOUTH EVERYDAY AT BEDTIME 07/28/19   Guse, Janna Arch, FNP  potassium chloride (KLOR-CON) 10 MEQ tablet Take 1 tablet (10 mEq total) by mouth every other day. 06/09/20   Allegra Grana, FNP  rosuvastatin (CRESTOR) 20 MG tablet Take 1 tablet (20 mg total) by mouth daily. 05/31/20   Allegra Grana, FNP  sitaGLIPtin (JANUVIA) 50 MG tablet Take 1 tablet (50 mg total) by mouth daily. 05/31/20   Allegra Grana, FNP  Tiotropium Bromide Monohydrate (SPIRIVA RESPIMAT) 2.5 MCG/ACT AERS Inhale 2 puffs into the lungs daily. 02/29/20   Salena Saner, MD  vitamin B-12 (CYANOCOBALAMIN) 500 MCG tablet Take 1 tablet (500 mcg total) by mouth daily.  05/31/20   Allegra Grana, FNP    Family History Family History  Problem Relation Age of Onset  . Heart disease Father     Social History Social History   Tobacco Use  . Smoking status: Current Some Day Smoker    Packs/day: 0.75    Years: 39.00    Pack years: 29.25    Types: Cigarettes    Start date: 02/29/1980  . Smokeless tobacco: Never Used  . Tobacco comment: 3-4 cigarettes a day currently  Vaping Use  . Vaping Use: Never used     Allergies   Patient has no known allergies.   Review of Systems Review  of Systems   Physical Exam Triage Vital Signs ED Triage Vitals  Enc Vitals Group     BP 10/02/20 1808 (!) 175/74     Pulse Rate 10/02/20 1808 90     Resp 10/02/20 1808 19     Temp 10/02/20 1808 98.3 F (36.8 C)     Temp Source 10/02/20 1808 Oral     SpO2 10/02/20 1808 95 %     Weight 10/02/20 1807 227 lb (103 kg)     Height 10/02/20 1807 6' (1.829 m)     Head Circumference --      Peak Flow --      Pain Score 10/02/20 1807 5     Pain Loc --      Pain Edu? --      Excl. in GC? --    No data found.  Updated Vital Signs BP (!) 175/74 (BP Location: Right Arm)   Pulse 90   Temp 98.3 F (36.8 C) (Oral)   Resp 19   Ht 6' (1.829 m)   Wt 227 lb (103 kg)   SpO2 95%   BMI 30.79 kg/m      Physical Exam Vitals and nursing note reviewed.  Constitutional:      General: He is not in acute distress.    Appearance: Normal appearance. He is well-developed and well-nourished. He is not ill-appearing.  HENT:     Head: Normocephalic and atraumatic.  Eyes:     Conjunctiva/sclera: Conjunctivae normal.  Cardiovascular:     Rate and Rhythm: Normal rate and regular rhythm.     Heart sounds: No murmur heard.   Pulmonary:     Effort: Pulmonary effort is normal. No respiratory distress.     Breath sounds: Normal breath sounds.  Abdominal:     Palpations: Abdomen is soft.     Tenderness: There is no abdominal tenderness.  Musculoskeletal:        General: Swelling and tenderness present. No edema.     Cervical back: Normal range of motion and neck supple.     Comments: L foot and ankle  Skin:    General: Skin is warm and dry.     Capillary Refill: Capillary refill takes less than 2 seconds.  Neurological:     General: No focal deficit present.     Mental Status: He is alert and oriented to person, place, and time.  Psychiatric:        Mood and Affect: Mood and affect and mood normal.        Behavior: Behavior normal.        Thought Content: Thought content normal.      UC  Treatments / Results  Labs (all labs ordered are listed, but only abnormal results are displayed) Labs Reviewed - No data to display  EKG  Radiology No results found.  Procedures Procedures (including critical care time)  Medications Ordered in UC Medications - No data to display  Initial Impression / Assessment and Plan / UC Course  I have reviewed the triage vital signs and the nursing notes.  Pertinent labs & imaging results that were available during my care of the patient were reviewed by me and considered in my medical decision making (see chart for details).    Left ankle pain Left ankle swelling  Xray negative today Lymphedema vs cellulitis Prescribed lasix  Prescribed Bactrim Follow up with this office or with primary care if symptoms are persisting.  Follow up in the ER for high fever, trouble swallowing, trouble breathing, other concerning symptoms.   Final Clinical Impressions(s) / UC Diagnoses   Final diagnoses:  Left ankle swelling  Acute left ankle pain     Discharge Instructions     I have sent in lasix for you to take for the next 5 days   I have sent in Bactrim as an antibiotic to take twice a day for 7 days  Xray was negative for any fracture or dislocation  Follow up with this office or with primary care if symptoms are persisting.  Follow up in the ER for high fever, trouble swallowing, trouble breathing, other concerning symptoms.     ED Prescriptions    Medication Sig Dispense Auth. Provider   sulfamethoxazole-trimethoprim (BACTRIM DS) 800-160 MG tablet Take 1 tablet by mouth 2 (two) times daily for 7 days. 14 tablet Faustino Congress, NP   furosemide (LASIX) 20 MG tablet Take 1 tablet (20 mg total) by mouth daily for 15 days. 15 tablet Faustino Congress, NP     PDMP not reviewed this encounter.   Faustino Congress, NP 10/05/20 1053

## 2020-10-04 ENCOUNTER — Telehealth: Payer: Self-pay

## 2020-10-04 NOTE — Telephone Encounter (Signed)
Pts wife states that the patients foot is still red. Per [facility] patient should give the antibiotics 3 days. If no improvement or worsening symptoms he can come back. Verbalized understanding.

## 2020-10-09 ENCOUNTER — Ambulatory Visit: Payer: 59 | Admitting: Family

## 2020-10-20 ENCOUNTER — Telehealth: Payer: Self-pay | Admitting: Family

## 2020-10-20 DIAGNOSIS — E119 Type 2 diabetes mellitus without complications: Secondary | ICD-10-CM

## 2020-10-24 MED ORDER — SITAGLIPTIN PHOSPHATE 50 MG PO TABS: 50.0000 mg | ORAL_TABLET | Freq: Every day | ORAL | 1 refills | Status: DC

## 2020-10-24 NOTE — Addendum Note (Signed)
Addended byElise Benne T on: 10/24/2020 08:41 AM   Modules accepted: Orders

## 2020-10-24 NOTE — Telephone Encounter (Signed)
Pt wants to know why Januvia medication was refused? He only has 2 pills left and pharmacy told him they are waiting on [place] to approve this medication. He would like a call back.

## 2020-10-27 ENCOUNTER — Telehealth: Payer: Self-pay | Admitting: Family

## 2020-10-27 NOTE — Telephone Encounter (Signed)
Patient was under the impression patient was only taking 25mg  and she went to pick up medication today the price was a lot more. Informed wife that patient has been taking the 50mg  since 04-21-20

## 2020-10-27 NOTE — Telephone Encounter (Signed)
Patient's wife called and needs to speak to someone about her husbands sitaGLIPtin (JANUVIA) 50 MG tablet. She is on Patient's DPR, please call Raynelle Fanning at [phone]

## 2020-11-06 ENCOUNTER — Ambulatory Visit: Payer: 59 | Admitting: Family

## 2020-11-06 ENCOUNTER — Other Ambulatory Visit: Payer: Self-pay

## 2020-11-06 ENCOUNTER — Encounter: Payer: Self-pay | Admitting: Family

## 2020-11-06 VITALS — BP 160/70 | HR 92 | Temp 98.2°F | Ht 72.0 in | Wt 230.4 lb

## 2020-11-06 DIAGNOSIS — I10 Essential (primary) hypertension: Secondary | ICD-10-CM | POA: Diagnosis not present

## 2020-11-06 DIAGNOSIS — E782 Mixed hyperlipidemia: Secondary | ICD-10-CM

## 2020-11-06 DIAGNOSIS — E538 Deficiency of other specified B group vitamins: Secondary | ICD-10-CM

## 2020-11-06 DIAGNOSIS — E119 Type 2 diabetes mellitus without complications: Secondary | ICD-10-CM | POA: Diagnosis not present

## 2020-11-06 MED ORDER — AMLODIPINE BESY-BENAZEPRIL HCL 10-40 MG PO CAPS: 1.0000 | ORAL_CAPSULE | Freq: Every day | ORAL | 1 refills | Status: DC

## 2020-11-06 NOTE — Assessment & Plan Note (Signed)
Anticipate improved. Continue januvia 50mg  ( he started one week ago), metformin 1000mg  bid

## 2020-11-06 NOTE — Assessment & Plan Note (Signed)
Uncontrolled. Increase lotrel to 10-40mg . Patient will call with BP readings. Labs in one week.

## 2020-11-06 NOTE — Progress Notes (Signed)
Subjective:    Patient ID: Thomas Brandt, male    DOB: 1961-01-02, 60 y.o.   MRN: [id number]  CC: Thomas Brandt is a 60 y.o. male who presents today for follow up.   HPI: Feels well today  No complaints  HTN- at home 145/75. compliant lotrel 10-20, hctz 12.5mg  , potassium chloride 10 meq qod. No cp, sob  DM- compliant with [place] 50mg  which he started this past week; compliant with metformin 1000mg  bid  HLD- compliant with crestor 20mg      Congestion has resolved since quit smoking. He vapes now.  Using spirva, symbicort however less often.  Declines colonoscopy, CT lung cancer screen.   HISTORY:  Past Medical History:  Diagnosis Date  . Bell's palsy    60 years old  . Hyperlipidemia   . Hypertension   . Stroke [facility]) 2018   No past surgical history on file. Family History  Problem Relation Age of Onset  . Heart disease Father     Allergies: Patient has no known allergies. Current Outpatient Medications on File Prior to Visit  Medication Sig Dispense Refill  . aspirin 81 MG chewable tablet Chew by mouth daily.    . budesonide-formoterol (SYMBICORT) 160-4.5 MCG/ACT inhaler TAKE 2 PUFFS BY MOUTH TWICE A DAY 30 Inhaler 2  . cetirizine (ZYRTEC) 10 MG tablet TAKE 1 TABLET BY MOUTH EVERY DAY 90 tablet 3  . hydrochlorothiazide (MICROZIDE) 12.5 MG capsule TAKE 1 CAPSULE BY MOUTH EVERY DAY 90 capsule 1  . metFORMIN (GLUCOPHAGE) 1000 MG tablet Take 1 tablet (1,000 mg total) by mouth 2 (two) times daily with a meal. 180 tablet 3  . potassium chloride (KLOR-CON) 10 MEQ tablet Take 1 tablet (10 mEq total) by mouth every other day. 60 tablet 1  . rosuvastatin (CRESTOR) 20 MG tablet Take 1 tablet (20 mg total) by mouth daily. 90 tablet 1  . sitaGLIPtin (JANUVIA) 50 MG tablet Take 1 tablet (50 mg total) by mouth daily. 90 tablet 1  . Tiotropium Bromide Monohydrate (SPIRIVA RESPIMAT) 2.5 MCG/ACT AERS Inhale 2 puffs into the lungs daily. 4 g 0  . vitamin B-12 (CYANOCOBALAMIN)  500 MCG tablet Take 1 tablet (500 mcg total) by mouth daily. 90 tablet 1  . cholecalciferol (VITAMIN D) 400 units TABS tablet Take 1,000 Units by mouth. (Patient not taking: Reported on 11/06/2020)    . montelukast (SINGULAIR) 10 MG tablet TAKE 1 TABLET BY MOUTH EVERYDAY AT BEDTIME (Patient not taking: Reported on 11/06/2020) 90 tablet 1   Current Facility-Administered Medications on File Prior to Visit  Medication Dose Route Frequency Provider Last Rate Last Admin  . albuterol (PROVENTIL) (2.5 MG/3ML) 0.083% nebulizer solution 2.5 mg  2.5 mg Nebulization Once 2019, MD        Social History   Tobacco Use  . Smoking status: Former Smoker    Packs/day: 0.75    Years: 39.00    Pack years: 29.25    Types: Cigarettes    Start date: 02/29/1980    Quit date: 04/30/2020    Years since quitting: 0.5  . Smokeless tobacco: Never Used  . Tobacco comment: 3-4 cigarettes a day currently  Vaping Use  . Vaping Use: Some days    Review of Systems  Constitutional: Negative for chills and fever.  HENT: Negative for congestion (resolved).   Respiratory: Negative for cough.   Cardiovascular: Negative for chest pain and palpitations.  Gastrointestinal: Negative for nausea and vomiting.      Objective:  BP (!) 160/70 (BP Location: Left Arm, Patient Position: Sitting, Cuff Size: Large)   Pulse 92   Temp 98.2 F (36.8 C) (Oral)   Ht 6' (1.829 m)   Wt 230 lb 6.4 oz (104.5 kg)   SpO2 97%   BMI 31.25 kg/m  BP Readings from Last 3 Encounters:  11/06/20 (!) 160/70  10/02/20 (!) 175/74  03/29/20 (!) 150/70   Wt Readings from Last 3 Encounters:  11/06/20 230 lb 6.4 oz (104.5 kg)  10/02/20 227 lb (103 kg)  05/31/20 225 lb (102.1 kg)    Physical Exam Vitals reviewed.  Constitutional:      Appearance: He is well-developed and well-nourished.  Cardiovascular:     Rate and Rhythm: Regular rhythm.     Heart sounds: Normal heart sounds.  Pulmonary:     Effort: Pulmonary effort is  normal. No respiratory distress.     Breath sounds: Normal breath sounds. No wheezing, rhonchi or rales.  Skin:    General: Skin is warm and dry.  Neurological:     Mental Status: He is alert.  Psychiatric:        Mood and Affect: Mood and affect normal.        Speech: Speech normal.        Behavior: Behavior normal.        Assessment & Plan:   Problem List Items Addressed This Visit      Cardiovascular and Mediastinum   Essential hypertension    Uncontrolled. Increase lotrel to 10-40mg . Patient will call with BP readings. Labs in one week.      Relevant Medications   amLODipine-benazepril (LOTREL) 10-40 MG capsule   Other Relevant Orders   Comprehensive metabolic panel     Endocrine   Type 2 diabetes mellitus without complication, without long-term current use of insulin (HCC) - Primary    Anticipate improved. Continue januvia 50mg  ( he started one week ago), metformin 1000mg  bid        Relevant Medications   amLODipine-benazepril (LOTREL) 10-40 MG capsule   Other Relevant Orders   Hemoglobin A1c   Comprehensive metabolic panel   Magnesium     Other   Mixed hyperlipidemia    Stable. Continue crestor 20mg       Relevant Medications   amLODipine-benazepril (LOTREL) 10-40 MG capsule    Other Visit Diagnoses    B12 deficiency       Relevant Orders   B12 and Folate Panel   Homocysteine   Methylmalonic acid, serum   Intrinsic Factor Antibodies   Anti-parietal antibody       I have discontinued Stern's amLODipine-benazepril and furosemide. I am also having him start on amLODipine-benazepril. Additionally, I am having him maintain his cholecalciferol, aspirin, montelukast, Spiriva Respimat, metFORMIN, budesonide-formoterol, vitamin B-12, rosuvastatin, potassium chloride, hydrochlorothiazide, cetirizine, and sitaGLIPtin.   Meds ordered this encounter  Medications  . amLODipine-benazepril (LOTREL) 10-40 MG capsule    Sig: Take 1 capsule by mouth  daily.    Dispense:  90 capsule    Refill:  1    Return precautions given.   Risks, benefits, and alternatives of the medications and treatment plan prescribed today were discussed, and patient expressed understanding.   Education regarding symptom management and diagnosis given to patient on AVS.  Continue to follow with , FNP for routine health maintenance.   and I agreed with plan.   Rosalyn Gess, FNP

## 2020-11-06 NOTE — Patient Instructions (Signed)
Increase lotrel to 10-40mg  Labs in ONE week  It is imperative that you are seen AT least twice per year for labs and monitoring. Monitor blood pressure at home and me 5-6 reading on separate days. Goal is less than 120/80, based on newest guidelines, however we certainly want to be less than 130/80;  if persistently higher, please make sooner follow up appointment so we can recheck you blood pressure and manage/ adjust medications.

## 2020-11-06 NOTE — Assessment & Plan Note (Signed)
Stable. Continue crestor 20mg 

## 2020-11-13 ENCOUNTER — Other Ambulatory Visit: Payer: 59

## 2020-11-20 ENCOUNTER — Other Ambulatory Visit (INDEPENDENT_AMBULATORY_CARE_PROVIDER_SITE_OTHER): Payer: 59

## 2020-11-20 ENCOUNTER — Other Ambulatory Visit: Payer: Self-pay

## 2020-11-20 DIAGNOSIS — I10 Essential (primary) hypertension: Secondary | ICD-10-CM | POA: Diagnosis not present

## 2020-11-20 DIAGNOSIS — E538 Deficiency of other specified B group vitamins: Secondary | ICD-10-CM

## 2020-11-20 DIAGNOSIS — E119 Type 2 diabetes mellitus without complications: Secondary | ICD-10-CM

## 2020-11-21 LAB — COMPREHENSIVE METABOLIC PANEL
ALT: 35 U/L (ref 0–53)
AST: 25 U/L (ref 0–37)
Albumin: 4.4 g/dL (ref 3.5–5.2)
Alkaline Phosphatase: 66 U/L (ref 39–117)
BUN: 17 mg/dL (ref 6–23)
CO2: 30 mEq/L (ref 19–32)
Calcium: 9.5 mg/dL (ref 8.4–10.5)
Chloride: 98 mEq/L (ref 96–112)
Creatinine, Ser: 1.03 mg/dL (ref 0.40–1.50)
GFR: 79.68 mL/min (ref >60.00)
Glucose, Bld: 144 mg/dL — ABNORMAL HIGH (ref 70–99)
Potassium: 4.3 mEq/L (ref 3.5–5.1)
Sodium: 139 mEq/L (ref 135–145)
Total Bilirubin: 0.8 mg/dL (ref 0.2–1.2)
Total Protein: 7.3 g/dL (ref 6.0–8.3)

## 2020-11-21 LAB — HEMOGLOBIN A1C: Hgb A1c MFr Bld: 7.6 % — ABNORMAL HIGH (ref 4.6–6.5)

## 2020-11-21 LAB — B12 AND FOLATE PANEL
Folate: 6.1 ng/mL (ref >5.9)
Vitamin B-12: 219 pg/mL (ref 211–911)

## 2020-11-21 LAB — MAGNESIUM: Magnesium: 1.5 mg/dL (ref 1.5–2.5)

## 2020-11-23 LAB — METHYLMALONIC ACID, SERUM: Methylmalonic Acid, Quant: 226 nmol/L (ref 87–318)

## 2020-11-23 LAB — ANTI-PARIETAL ANTIBODY: PARIETAL CELL AB SCREEN: NEGATIVE

## 2020-11-23 LAB — HOMOCYSTEINE: Homocysteine: 10.9 umol/L (ref <11.4)

## 2020-11-23 LAB — INTRINSIC FACTOR ANTIBODIES: Intrinsic Factor: NEGATIVE

## 2020-11-25 ENCOUNTER — Other Ambulatory Visit: Payer: Self-pay | Admitting: Family

## 2020-11-25 DIAGNOSIS — E782 Mixed hyperlipidemia: Secondary | ICD-10-CM

## 2020-12-04 ENCOUNTER — Other Ambulatory Visit: Payer: Self-pay | Admitting: Family

## 2020-12-04 DIAGNOSIS — I10 Essential (primary) hypertension: Secondary | ICD-10-CM

## 2020-12-18 ENCOUNTER — Telehealth: Payer: Self-pay

## 2020-12-18 DIAGNOSIS — I639 Cerebral infarction, unspecified: Secondary | ICD-10-CM

## 2020-12-18 NOTE — Telephone Encounter (Signed)
Pt dropped off form for DOT. He states that it has to be turned back in by the end of March. Please call pt back when completed. Placed in folder up front

## 2020-12-19 NOTE — Telephone Encounter (Signed)
I have placed form in your folder. After consulting Dr. Lorin Picket what [facility] was referring to after they did DOT physical, she stated that patient may need to be referred for a formal driving evaluation. [facility] felt that due to his hx of stroke there was some neurological concerns. I do not see a neurologist in patient's care team either.

## 2020-12-20 NOTE — Telephone Encounter (Signed)
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 If he would like to call for an appointment as well, he may do so. Phone number is [phone]    Let us know if you dont hear back within a week in regards to an appointment being scheduled

## 2020-12-20 NOTE — Addendum Note (Signed)
Addended by: Allegra Grana on: 12/20/2020 08:16 AM   Modules accepted: Orders

## 2020-12-20 NOTE — Telephone Encounter (Signed)
I spoke with patient & advised on below. I gave patient number to neurology & he will try to call. He said that he may try to have DOT done elsewhere & was not happy with having to be evaluated. 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   He asked for copy of a1c & I have emailed to patient.

## 2020-12-20 NOTE — Telephone Encounter (Signed)
noted 

## 2020-12-25 ENCOUNTER — Other Ambulatory Visit: Payer: Self-pay | Admitting: Family

## 2020-12-25 DIAGNOSIS — I10 Essential (primary) hypertension: Secondary | ICD-10-CM

## 2021-01-14 ENCOUNTER — Other Ambulatory Visit: Payer: Self-pay | Admitting: Family

## 2021-01-14 DIAGNOSIS — I10 Essential (primary) hypertension: Secondary | ICD-10-CM

## 2021-01-14 DIAGNOSIS — E119 Type 2 diabetes mellitus without complications: Secondary | ICD-10-CM

## 2021-01-14 DIAGNOSIS — J449 Chronic obstructive pulmonary disease, unspecified: Secondary | ICD-10-CM

## 2021-01-19 ENCOUNTER — Telehealth: Payer: Self-pay | Admitting: Family

## 2021-01-19 NOTE — Telephone Encounter (Signed)
lft pt vm to call to follow up on referral. tahnks

## 2021-01-26 ENCOUNTER — Telehealth: Payer: Self-pay | Admitting: Family

## 2021-01-26 NOTE — Telephone Encounter (Signed)
I spoke with pt to follow up on referral to [facility] neurology, pt stated he does not need it anymore.

## 2021-01-29 NOTE — Telephone Encounter (Signed)
noted 

## 2021-02-05 ENCOUNTER — Ambulatory Visit (INDEPENDENT_AMBULATORY_CARE_PROVIDER_SITE_OTHER): Payer: 59 | Admitting: Family

## 2021-02-05 ENCOUNTER — Other Ambulatory Visit: Payer: Self-pay

## 2021-02-05 ENCOUNTER — Encounter: Payer: Self-pay | Admitting: Family

## 2021-02-05 VITALS — BP 142/70 | HR 87 | Temp 98.0°F | Ht 72.0 in | Wt 230.4 lb

## 2021-02-05 DIAGNOSIS — I10 Essential (primary) hypertension: Secondary | ICD-10-CM

## 2021-02-05 DIAGNOSIS — E119 Type 2 diabetes mellitus without complications: Secondary | ICD-10-CM

## 2021-02-05 DIAGNOSIS — F17200 Nicotine dependence, unspecified, uncomplicated: Secondary | ICD-10-CM

## 2021-02-05 DIAGNOSIS — E782 Mixed hyperlipidemia: Secondary | ICD-10-CM

## 2021-02-05 MED ORDER — AMLODIPINE BESYLATE 10 MG PO TABS: 10.0000 mg | ORAL_TABLET | Freq: Every day | ORAL | 1 refills | Status: DC

## 2021-02-05 MED ORDER — RYBELSUS 3 MG PO TABS: 3.0000 mg | ORAL_TABLET | Freq: Every day | ORAL | 1 refills | Status: AC

## 2021-02-05 MED ORDER — TELMISARTAN 40 MG PO TABS
40.0000 mg | ORAL_TABLET | Freq: Every day | ORAL | 1 refills | Status: DC
Start: 2021-02-05 — End: 2021-07-30

## 2021-02-05 NOTE — Assessment & Plan Note (Signed)
Uncontrolled. Stop lisinopril. Start telmisartan 40mg . Continue hctz 12.5mg , amlodipine 10mg  Labs in one week

## 2021-02-05 NOTE — Assessment & Plan Note (Signed)
Former smoker. Again encouraged CT chest lung cancer screen; he declines.

## 2021-02-05 NOTE — Progress Notes (Signed)
Subjective:    Patient ID: Thomas Brandt, male    DOB: Oct 15, 1960, 60 y.o.   MRN: [id number]  CC: Thomas Brandt is a 60 y.o. male who presents today for follow up.   HPI: Low back pain stiffness, for years. 'there is no pain'. Stiffness when he bends over to tie his shoes. Notes recent weight gain as he has quit smoking  No numbness, trouble urinating, saddle anesthesia. No personal or family h/o thyroid cancer.     HTN- compliant with lotrel 10-40mg , hctz 12.5mg  with Kcl QOD No cp, sob  DM- compliant with januvia 50mg , metformin 1000mg  bid No numbness or wounds in feet. Declines DM foot exam.  HLD- compliant with crestor 20mg  Due colonoscopy- he declines Declines CT lung cancer     HISTORY:  Past Medical History:  Diagnosis Date  . Bell's palsy    60 years old  . Hyperlipidemia   . Hypertension   . Stroke [facility]) 2018   History reviewed. No pertinent surgical history. Family History  Problem Relation Age of Onset  . Heart disease Father   . Thyroid cancer Neg Hx     Allergies: Patient has no known allergies. Current Outpatient Medications on File Prior to Visit  Medication Sig Dispense Refill  . aspirin 81 MG chewable tablet Chew by mouth daily.    . budesonide-formoterol (SYMBICORT) 160-4.5 MCG/ACT inhaler TAKE 2 PUFFS BY MOUTH TWICE A DAY 30.6 each 3  . cetirizine (ZYRTEC) 10 MG tablet TAKE 1 TABLET BY MOUTH EVERY DAY 90 tablet 3  . hydrochlorothiazide (MICROZIDE) 12.5 MG capsule TAKE 1 CAPSULE BY MOUTH EVERY DAY 90 capsule 1  . metFORMIN (GLUCOPHAGE) 1000 MG tablet Take 1 tablet (1,000 mg total) by mouth 2 (two) times daily with a meal. 180 tablet 3  . montelukast (SINGULAIR) 10 MG tablet TAKE 1 TABLET BY MOUTH EVERYDAY AT BEDTIME 90 tablet 1  . potassium chloride (KLOR-CON) 10 MEQ tablet TAKE 1 TABLET (10 MEQ TOTAL) BY MOUTH EVERY OTHER DAY. 45 tablet 2  . rosuvastatin (CRESTOR) 20 MG tablet TAKE 1 TABLET BY MOUTH EVERY DAY 90 tablet 1  . Tiotropium  Bromide Monohydrate (SPIRIVA RESPIMAT) 2.5 MCG/ACT AERS Inhale 2 puffs into the lungs daily. 4 g 0  . cholecalciferol (VITAMIN D) 400 units TABS tablet Take 1,000 Units by mouth. (Patient not taking: Reported on 02/05/2021)     Current Facility-Administered Medications on File Prior to Visit  Medication Dose Route Frequency Provider Last Rate Last Admin  . albuterol (PROVENTIL) (2.5 MG/3ML) 0.083% nebulizer solution 2.5 mg  2.5 mg Nebulization Once [facility], MD        Social History   Tobacco Use  . Smoking status: Former Smoker    Packs/day: 0.75    Years: 39.00    Pack years: 29.25    Types: Cigarettes    Start date: 02/29/1980    Quit date: 04/30/2020    Years since quitting: 0.7  . Smokeless tobacco: Never Used  . Tobacco comment: 3-4 cigarettes a day currently  Vaping Use  . Vaping Use: Some days    Review of Systems  Constitutional: Negative for chills and fever.  Eyes: Negative for visual disturbance.  Respiratory: Negative for cough.   Cardiovascular: Negative for chest pain and palpitations.  Gastrointestinal: Negative for nausea and vomiting.  Endocrine: Negative for polydipsia, polyphagia and polyuria.  Genitourinary: Negative for difficulty urinating.  Musculoskeletal: Positive for back pain.  Neurological: Negative for numbness.  Objective:    BP (!) 142/70 (BP Location: Left Arm, Patient Position: Sitting, Cuff Size: Large)   Pulse 87   Temp 98 F (36.7 C) (Oral)   Ht 6' (1.829 m)   Wt 230 lb 6.4 oz (104.5 kg)   SpO2 96%   BMI 31.25 kg/m  BP Readings from Last 3 Encounters:  02/05/21 (!) 142/70  11/06/20 (!) 160/70  10/02/20 (!) 175/74   Wt Readings from Last 3 Encounters:  02/05/21 230 lb 6.4 oz (104.5 kg)  11/06/20 230 lb 6.4 oz (104.5 kg)  10/02/20 227 lb (103 kg)    Physical Exam Vitals reviewed.  Constitutional:      Appearance: He is well-developed.  Cardiovascular:     Rate and Rhythm: Regular rhythm.     Heart sounds:  Normal heart sounds.  Pulmonary:     Effort: Pulmonary effort is normal. No respiratory distress.     Breath sounds: Normal breath sounds. No wheezing, rhonchi or rales.  Musculoskeletal:     Lumbar back: No swelling, spasms or tenderness. Normal range of motion.     Comments: Full range of motion with flexion, extension, lateral side bends. No pain, numbness, tingling elicited with single leg raise bilaterally. No rash.  Skin:    General: Skin is warm and dry.  Neurological:     Mental Status: He is alert.  Psychiatric:        Speech: Speech normal.        Behavior: Behavior normal.        Assessment & Plan:   Problem List Items Addressed This Visit      Cardiovascular and Mediastinum   Essential hypertension - Primary    Uncontrolled. Stop lisinopril. Start telmisartan 40mg . Continue hctz 12.5mg , amlodipine 10mg  Labs in one week      Relevant Medications   Semaglutide (RYBELSUS) 3 MG TABS   amLODipine (NORVASC) 10 MG tablet   telmisartan (MICARDIS) 40 MG tablet   Other Relevant Orders   Comprehensive metabolic panel   DG Lumbar Spine Complete   Hemoglobin A1c   PSA   Urinalysis, Routine w reflex microscopic     Endocrine   Type 2 diabetes mellitus without complication, without long-term current use of insulin (HCC)    Uncontrolled. In setting of weight gain and a1c not being at goal for 2 years consistently while on januvia, we agreed to stop , start rybelsus 3mg , continue metformin 1000mg  bid.pending a1c in 2 weeks. Counseled on black box warning regarding medullary thyroid cancer.       Relevant Medications   Semaglutide (RYBELSUS) 3 MG TABS   telmisartan (MICARDIS) 40 MG tablet     Other   Mixed hyperlipidemia    Controlled. Continue crestor 40mg .      Relevant Medications   amLODipine (NORVASC) 10 MG tablet   telmisartan (MICARDIS) 40 MG tablet   Tobacco use disorder    Former smoker. Again encouraged CT chest lung cancer screen; he declines.            I have discontinued 12-27-1994 Engelmann's vitamin B-12, sitaGLIPtin, and amLODipine-benazepril. I am also having him start on Rybelsus, amLODipine, and telmisartan. Additionally, I am having him maintain his cholecalciferol, aspirin, montelukast, Spiriva Respimat, metFORMIN, cetirizine, rosuvastatin, potassium chloride, budesonide-formoterol, and hydrochlorothiazide.   Meds ordered this encounter  Medications  . Semaglutide (RYBELSUS) 3 MG TABS    Sig: Take 3 mg by mouth daily.    Dispense:  90 tablet    Refill:  1    Order Specific Question:   Supervising Provider    Answer:   Duncan Dull L [[id number]]  . amLODipine (NORVASC) 10 MG tablet    Sig: Take 1 tablet (10 mg total) by mouth daily.    Dispense:  90 tablet    Refill:  1    Order Specific Question:   Supervising Provider    Answer:   Duncan Dull L [[id number]]  . telmisartan (MICARDIS) 40 MG tablet    Sig: Take 1 tablet (40 mg total) by mouth daily.    Dispense:  90 tablet    Refill:  1    Order Specific Question:   Supervising Provider    Answer:   Sherlene Shams [[id number]]    Return precautions given.   Risks, benefits, and alternatives of the medications and treatment plan prescribed today were discussed, and patient expressed understanding.   Education regarding symptom management and diagnosis given to patient on AVS.  Continue to follow with Allegra Grana, FNP for routine health maintenance.   Lucky Rathke and I agreed with plan.   Thomas Plowman, FNP

## 2021-02-05 NOTE — Assessment & Plan Note (Signed)
Controlled. Continue crestor 40mg 

## 2021-02-05 NOTE — Assessment & Plan Note (Addendum)
Uncontrolled. In setting of weight gain and a1c not being at goal for 2 years consistently while on januvia, we agreed to stop [place], start rybelsus 3mg , continue metformin 1000mg  bid.pending a1c in 2 weeks. Counseled on black box warning regarding medullary thyroid cancer.

## 2021-02-05 NOTE — Patient Instructions (Addendum)
Labs 7 days after starting telmisartan  Stop LOTREL and januvia  Start rybelsus, amlodipine 10mg , telmisartan 40mg , and continue HCTZ 12.5mg .  It is imperative that you are seen AT least twice per year for labs and monitoring. Monitor blood pressure at home and me 5-6 reading on separate days. Goal is less than 120/80, based on newest guidelines, however we certainly want to be less than 130/80;  if persistently higher, please make sooner follow up appointment so we can recheck you blood pressure and manage/ adjust medications.

## 2021-02-16 ENCOUNTER — Other Ambulatory Visit: Payer: 59

## 2021-02-20 ENCOUNTER — Other Ambulatory Visit (INDEPENDENT_AMBULATORY_CARE_PROVIDER_SITE_OTHER): Payer: 59

## 2021-02-20 ENCOUNTER — Other Ambulatory Visit: Payer: Self-pay

## 2021-02-20 DIAGNOSIS — I10 Essential (primary) hypertension: Secondary | ICD-10-CM

## 2021-02-20 NOTE — Addendum Note (Signed)
Addended byElise Benne T on: 02/20/2021 03:48 PM   Modules accepted: Orders

## 2021-02-20 NOTE — Addendum Note (Signed)
Addended byElise Benne T on: 02/20/2021 03:51 PM   Modules accepted: Orders

## 2021-02-21 LAB — URINALYSIS, ROUTINE W REFLEX MICROSCOPIC
Bilirubin Urine: NEGATIVE
Hgb urine dipstick: NEGATIVE
Leukocytes,Ua: NEGATIVE
Nitrite: NEGATIVE
Specific Gravity, Urine: >=1.03 — AB (ref 1.000–1.030)
Total Protein, Urine: NEGATIVE
Urine Glucose: NEGATIVE
Urobilinogen, UA: 0.2 (ref 0.0–1.0)
pH: 5.5 (ref 5.0–8.0)

## 2021-02-21 LAB — COMPREHENSIVE METABOLIC PANEL
ALT: 29 U/L (ref 0–53)
AST: 23 U/L (ref 0–37)
Albumin: 4.2 g/dL (ref 3.5–5.2)
Alkaline Phosphatase: 71 U/L (ref 39–117)
BUN: 18 mg/dL (ref 6–23)
CO2: 28 mEq/L (ref 19–32)
Calcium: 9.2 mg/dL (ref 8.4–10.5)
Chloride: 100 mEq/L (ref 96–112)
Creatinine, Ser: 0.78 mg/dL (ref 0.40–1.50)
GFR: 97.65 mL/min (ref >60.00)
Glucose, Bld: 209 mg/dL — ABNORMAL HIGH (ref 70–99)
Potassium: 3.5 mEq/L (ref 3.5–5.1)
Sodium: 141 mEq/L (ref 135–145)
Total Bilirubin: 0.8 mg/dL (ref 0.2–1.2)
Total Protein: 6.8 g/dL (ref 6.0–8.3)

## 2021-02-21 LAB — HEMOGLOBIN A1C: Hgb A1c MFr Bld: 8.3 % — ABNORMAL HIGH (ref 4.6–6.5)

## 2021-02-21 LAB — PSA: PSA: 0.39 ng/mL (ref 0.10–4.00)

## 2021-02-23 ENCOUNTER — Other Ambulatory Visit: Payer: Self-pay | Admitting: Family

## 2021-02-23 DIAGNOSIS — E119 Type 2 diabetes mellitus without complications: Secondary | ICD-10-CM

## 2021-03-26 ENCOUNTER — Other Ambulatory Visit: Payer: Self-pay | Admitting: Family

## 2021-03-26 DIAGNOSIS — I10 Essential (primary) hypertension: Secondary | ICD-10-CM

## 2021-03-26 DIAGNOSIS — E782 Mixed hyperlipidemia: Secondary | ICD-10-CM

## 2021-03-30 ENCOUNTER — Telehealth: Payer: Self-pay | Admitting: Family

## 2021-03-30 MED ORDER — HYDROCHLOROTHIAZIDE 12.5 MG PO CAPS: ORAL_CAPSULE | ORAL | 1 refills | Status: DC

## 2021-03-30 NOTE — Telephone Encounter (Signed)
Patient needs his hydrochlorothiazide (MICROZIDE) 12.5 MG capsule refilled. He tried calling his pharmacy and they told him the never heard of that medication.

## 2021-05-09 ENCOUNTER — Ambulatory Visit: Payer: 59 | Admitting: Family

## 2021-05-25 ENCOUNTER — Ambulatory Visit: Payer: 59 | Admitting: Family

## 2021-05-28 ENCOUNTER — Ambulatory Visit: Payer: 59 | Admitting: Family

## 2021-06-18 ENCOUNTER — Other Ambulatory Visit: Payer: Self-pay

## 2021-06-18 ENCOUNTER — Encounter: Payer: Self-pay | Admitting: Family

## 2021-06-18 ENCOUNTER — Ambulatory Visit: Payer: 59 | Admitting: Family

## 2021-06-18 VITALS — BP 123/64 | HR 97 | Temp 98.4°F | Ht 72.0 in | Wt 226.4 lb

## 2021-06-18 DIAGNOSIS — E119 Type 2 diabetes mellitus without complications: Secondary | ICD-10-CM

## 2021-06-18 DIAGNOSIS — I10 Essential (primary) hypertension: Secondary | ICD-10-CM

## 2021-06-18 DIAGNOSIS — E782 Mixed hyperlipidemia: Secondary | ICD-10-CM | POA: Diagnosis not present

## 2021-06-18 LAB — POCT GLYCOSYLATED HEMOGLOBIN (HGB A1C): Hemoglobin A1C: 8.4 % — AB (ref 4.0–5.6)

## 2021-06-18 NOTE — Progress Notes (Signed)
Subjective:    Patient ID: Thomas Brandt, male    DOB: 04-Jul-1961, 60 y.o.   MRN: [id number]  CC: Thomas Brandt is a 60 y.o. male who presents today for follow up.   HPI: Feels well today No new complaints    Hypertension-compliant with telmisartan 40 mg, HCTZ 12.5 mg, amlodipine 10 mg DM. No checking blood sugars. No blurry vision, increased thirst.  compliant with Rybelsus 3 mg, qhs with other medications,  metformin 1000 mg BID  He is no longer on Januvia  HLD- compliant with Crestor 40mg .   He declines screening for colon cancer. HISTORY:  Past Medical History:  Diagnosis Date   Bell's palsy    60 years old   Hyperlipidemia    Hypertension    Stroke (HCC) 2018   No past surgical history on file. Family History  Problem Relation Age of Onset   Heart disease Father    Thyroid cancer Neg Hx     Allergies: Patient has no known allergies. Current Outpatient Medications on File Prior to Visit  Medication Sig Dispense Refill   amLODipine (NORVASC) 10 MG tablet Take 1 tablet (10 mg total) by mouth daily. 90 tablet 1   aspirin 81 MG chewable tablet Chew by mouth daily.     budesonide-formoterol (SYMBICORT) 160-4.5 MCG/ACT inhaler TAKE 2 PUFFS BY MOUTH TWICE A DAY 30.6 each 3   cetirizine (ZYRTEC) 10 MG tablet TAKE 1 TABLET BY MOUTH EVERY DAY 90 tablet 3   hydrochlorothiazide (MICROZIDE) 12.5 MG capsule TAKE 1 CAPSULE BY MOUTH EVERY DAY 90 capsule 1   metFORMIN (GLUCOPHAGE) 1000 MG tablet TAKE 1 TABLET (1,000 MG TOTAL) BY MOUTH 2 (TWO) TIMES DAILY WITH A MEAL. 180 tablet 3   montelukast (SINGULAIR) 10 MG tablet TAKE 1 TABLET BY MOUTH EVERYDAY AT BEDTIME 90 tablet 1   potassium chloride (KLOR-CON) 10 MEQ tablet TAKE 1 TABLET (10 MEQ TOTAL) BY MOUTH EVERY OTHER DAY. 45 tablet 2   rosuvastatin (CRESTOR) 20 MG tablet TAKE 1 TABLET BY MOUTH EVERY DAY 90 tablet 1   RYBELSUS 3 MG TABS Take 1 tablet by mouth daily.     telmisartan (MICARDIS) 40 MG tablet Take 1 tablet (40 mg  total) by mouth daily. 90 tablet 1   Tiotropium Bromide Monohydrate (SPIRIVA RESPIMAT) 2.5 MCG/ACT AERS Inhale 2 puffs into the lungs daily. 4 g 0   Current Facility-Administered Medications on File Prior to Visit  Medication Dose Route Frequency Provider Last Rate Last Admin   albuterol (PROVENTIL) (2.5 MG/3ML) 0.083% nebulizer solution 2.5 mg  2.5 mg Nebulization Once 2019, MD        Social History   Tobacco Use   Smoking status: Former    Packs/day: 0.75    Years: 39.00    Pack years: 29.25    Types: Cigarettes    Start date: 02/29/1980    Quit date: 04/30/2020    Years since quitting: 1.1   Smokeless tobacco: Never   Tobacco comments:    3-4 cigarettes a day currently  Vaping Use   Vaping Use: Some days    Review of Systems  Constitutional:  Negative for chills and fever.  Respiratory:  Negative for cough.   Cardiovascular:  Negative for chest pain and palpitations.  Gastrointestinal:  Negative for nausea and vomiting.     Objective:    BP 123/64 (BP Location: Left Arm, Patient Position: Sitting, Cuff Size: Large)   Pulse 97   Temp 98.4 F (  36.9 C) (Oral)   Ht 6' (1.829 m)   Wt 226 lb 6.4 oz (102.7 kg)   SpO2 97%   BMI 30.71 kg/m  BP Readings from Last 3 Encounters:  06/18/21 123/64  02/05/21 (!) 142/70  11/06/20 (!) 160/70   Wt Readings from Last 3 Encounters:  06/18/21 226 lb 6.4 oz (102.7 kg)  02/05/21 230 lb 6.4 oz (104.5 kg)  11/06/20 230 lb 6.4 oz (104.5 kg)    Physical Exam Vitals reviewed.  Constitutional:      Appearance: He is well-developed.  Cardiovascular:     Rate and Rhythm: Regular rhythm.     Heart sounds: Normal heart sounds.  Pulmonary:     Effort: Pulmonary effort is normal. No respiratory distress.     Breath sounds: Normal breath sounds. No wheezing, rhonchi or rales.  Skin:    General: Skin is warm and dry.  Neurological:     Mental Status: He is alert.  Psychiatric:        Speech: Speech normal.         Behavior: Behavior normal.       Assessment & Plan:   Problem List Items Addressed This Visit       Cardiovascular and Mediastinum   Essential hypertension    Chronic, stable.  Continue  telmisartan 40 mg, HCTZ 12.5 mg, amlodipine 10 mg        Endocrine   Type 2 diabetes mellitus without complication, without long-term current use of insulin (HCC) - Primary    Lab Results  Component Value Date   HGBA1C 8.4 (A) 06/18/2021  Uncontrolled. Rybelsus 3 mg qhs with other medications; counseled the importance of taking this medication in the morning prior to a meal.  We will try this first prior to increasing Rybelsus.  Continue metformin 1000 mg BID      Relevant Medications   RYBELSUS 3 MG TABS   Other Relevant Orders   POCT HgB A1C (Completed)   Comprehensive metabolic panel   Lipid panel   Microalbumin / creatinine urine ratio     Other   Mixed hyperlipidemia    Anticipate controlled.  Pending lipid panel.  Continue Crestor 40 mg        I have discontinued Jarvis Sawa. [place]'s cholecalciferol and amLODipine-benazepril. I am also having him maintain his aspirin, montelukast, Spiriva Respimat, cetirizine, potassium chloride, budesonide-formoterol, amLODipine, telmisartan, metFORMIN, rosuvastatin, hydrochlorothiazide, and Rybelsus.   No orders of the defined types were placed in this encounter.   Return precautions given.   Risks, benefits, and alternatives of the medications and treatment plan prescribed today were discussed, and patient expressed understanding.   Education regarding symptom management and diagnosis given to patient on AVS.  Continue to follow with Allegra Grana, FNP for routine health maintenance.   Lucky Rathke and I agreed with plan.   Rennie Plowman, FNP

## 2021-06-18 NOTE — Assessment & Plan Note (Signed)
Lab Results  Component Value Date   HGBA1C 8.4 (A) 06/18/2021   Uncontrolled. Rybelsus 3 mg qhs with other medications; counseled the importance of taking this medication in the morning prior to a meal.  We will try this first prior to increasing Rybelsus.  Continue metformin 1000 mg BID

## 2021-06-18 NOTE — Assessment & Plan Note (Signed)
Anticipate controlled.  Pending lipid panel.  Continue Crestor 40 mg

## 2021-06-18 NOTE — Assessment & Plan Note (Signed)
Chronic, stable.  Continue  telmisartan 40 mg, HCTZ 12.5 mg, amlodipine 10 mg

## 2021-06-18 NOTE — Patient Instructions (Addendum)
With the Rybelsus, be very sure that you take on an empty stomach with a little bit of water and take 30 minutes before eating. Do not take with other medicines and no other food with medication. This is important for effectiveness.   Call me with fasting blood sugar readings.   Goal of fasting blood sugar is between 70-120. If in this range, we are reaching our target a1c ( goal 6.5%)   Please check fasting blood sugar in the morning time once or twice a week.  You may also check if you feel like you are having a low episode or particularly high episode of blood sugar.  If blood sugars increase, I may advise you to check blood sugar after your largest meal.  You specifically do this TWO hours after largest meal with the goal of being less than 180.  If blood sugar is checked sooner than 2 hours after largest meal, and it will be  expected to be elevated. You must wait 2 hours.   If your blood sugar is less than 180 hours after your largest meal, again we are reaching our target a1c goal   Call [facility] clinic if: BG < 70 or > 300.   If you have any symptoms of low blood sugar ( sweating, shakiness, lightheaded, dizzy) that you notify me. If you have a low, please drink a glass of orange juice and recheck blood sugar every 5 minutes until you don't feel symptomatic AND blood sugar is above 80.    Remember black box warning that you may not take this medication if you or a family member is diagnosed with thyroid cancer.    Semaglutide Oral Tablets What is this medicine? SEMAGLUTIDE (Sem a GLOO tide) controls blood sugar in people with type 2 diabetes. It is used with lifestyle changes like diet and exercise. This medicine may be used for other purposes; ask your health care provider or pharmacist if you have questions. COMMON BRAND NAME(S): Rybelsus What should I tell my health care provider before I take this medicine? They need to know if you have any of these  conditions: endocrine tumors (MEN 2) or if someone in your family had these tumors eye disease history of pancreatitis kidney disease stomach or intestine problems thyroid cancer or if someone in your family had thyroid cancer vision problems an unusual or allergic reaction to semaglutide, other medicines, foods, dyes, or preservatives pregnant or trying to get pregnant breast-feeding How should I use this medicine? Take this medicine by mouth. Take it as directed on the prescription label at the same time every day. Take the dose right after waking up. Do not eat or drink anything before taking it. Do not take it with any other drink except a glass of plain water that is less than 4 ounces (less than 120 mL). Do not cut, crush or chew this medicine. Swallow the tablets whole. After taking it, do not eat breakfast, drink, or take any other medicines or vitamins for at least 30 minutes. Keep taking it unless your health care provider tells you to stop. A special MedGuide will be given to you by the pharmacist with each prescription and refill. Be sure to read this information carefully each time. Talk to your health care provider about the use of this medicine in children. Special care may be needed. Overdosage: If you think you have taken too much of this medicine contact a poison control center or emergency room at once. NOTE:  This medicine is only for you. Do not share this medicine with others. What if I miss a dose? If you miss a dose, skip it. Take your next dose at the normal time. Do not take extra or 2 doses at the same time to make up for the missed dose. What may interact with this medicine? What may interact with this medicine? aminophylline carbamazepine cyclosporine digoxin levothyroxine other medicines for diabetes phenytoin tacrolimus theophylline warfarin Many medications may cause changes in blood sugar, these include: alcohol containing beverages antiviral medicines  for HIV or AIDS aspirin and aspirin-like drugs certain medicines for blood pressure, heart disease, irregular heart beat chromium diuretics male hormones, such as estrogens or progestins, birth control pills fenofibrate gemfibrozil isoniazid lanreotide male hormones or anabolic steroids MAOIs like Carbex, Eldepryl, Marplan, Nardil, and Parnate medicines for weight loss medicines for allergies, asthma, cold, or cough medicines for depression, anxiety, or psychotic disturbances niacin nicotine NSAIDs, medicines for pain and inflammation, like ibuprofen or naproxen octreotide pasireotide pentamidine phenytoin probenecid quinolone antibiotics such as ciprofloxacin, levofloxacin, ofloxacin some herbal dietary supplements steroid medicines such as prednisone or cortisone sulfamethoxazole; trimethoprim thyroid hormones Some medications can hide the warning symptoms of low blood sugar (hypoglycemia). You may need to monitor your blood sugar more closely if you are taking one of these medications. These include: beta-blockers, often used for high blood pressure or heart problems (examples include atenolol, metoprolol, propranolol) clonidine guanethidine reserpine This list may not describe all possible interactions. Give your health care provider a list of all the medicines, herbs, non-prescription drugs, or dietary supplements you use. Also tell them if you smoke, drink alcohol, or use illegal drugs. Some items may interact with your medicine. What should I watch for while using this medicine? Visit your health care provider for regular checks on your progress. Check with your health care provider if you have severe diarrhea, nausea, and vomiting, or if you sweat a lot. The loss of too much body fluid may make it dangerous for you to take this medicine. A test called the HbA1C (A1C) will be monitored. This is a simple blood test. It measures your blood sugar control over the last 2 to  3 months. You will receive this test every 3 to 6 months. Learn how to check your blood sugar. Learn the symptoms of low and high blood sugar and how to manage them. Always carry a quick-source of sugar with you in case you have symptoms of low blood sugar. Examples include hard sugar candy or glucose tablets. Make sure others know that you can choke if you eat or drink when you develop serious symptoms of low blood sugar, such as seizures or unconsciousness. Get medical help at once. Tell your health care provider if you have high blood sugar. You might need to change the dose of your medicine. If you are sick or exercising more than usual, you might need to change the dose of your medicine. Do not skip meals. Ask your health care provider if you should avoid alcohol. Many nonprescription cough and cold products contain sugar or alcohol. These can affect blood sugar. Wear a medical ID bracelet or chain. Carry a card that describes your condition. List the medicines and doses you take on the card. Do not become pregnant while taking this medicine. Women should inform their health care provider if they wish to become pregnant or think they might be pregnant. There is a potential for serious side effects to an unborn child. Talk  to your health care provider for more information. Do not breast-feed an infant while taking this medicine. What side effects may I notice from receiving this medicine? Side effects that you should report to your doctor or health care provider as soon as possible: allergic reactions (skin rash, itching or hives; swelling of the face, lips, or tongue) changes in vision diarrhea that continues or is severe infection (fever, chills, cough, sore throat, pain or trouble passing urine) kidney injury (trouble passing urine or change in the amount of urine) low blood sugar (feeling anxious; confusion; dizziness; increased hunger; unusually weak or tired; increased sweating; shakiness;  cold, clammy skin; irritable; headache; blurred vision; fast heartbeat; loss of consciousness) lump or swelling on the neck painful or difficulty swallowing severe nausea severe or unusual stomach pain trouble breathing vomiting Side effects that usually do not require medical attention (report these to your doctor or health care provider if they continue or are bothersome): constipation diarrhea nausea upset stomach This list may not describe all possible side effects. Call your doctor for medical advice about side effects. You may report side effects to FDA at 1-800-FDA-1088. Where should I keep my medicine? Keep out of the reach of children and pets. Store at room temperature between 20 and 25 degrees C (68 and 77 degrees F). Keep this medicine in the original container. Protect from moisture. Keep the container tightly closed. Get rid of any unused medicine after the expiration date. To get rid of medicines that are no longer needed or have expired: Take the medicine to a medicine take-back program. Check with your pharmacy or law enforcement to find a location. If you cannot return the medicine, check the label or package insert to see if the medicine should be thrown out in the garbage or flushed down the toilet. If you are not sure, ask your health care provider. If it is safe to put it in the trash, take the medicine out of the container. Mix the medicine with cat litter, dirt, coffee grounds, or other unwanted substance. Seal the mixture in a bag or container. Put it in the trash. NOTE: This sheet is a summary. It may not cover all possible information. If you have questions about this medicine, talk to your doctor, pharmacist, or health care provider.  2021 [employer]vier/Gold Standard (2020-08-14 15:08:33)

## 2021-06-24 ENCOUNTER — Other Ambulatory Visit: Payer: Self-pay | Admitting: Pulmonary Disease

## 2021-07-04 ENCOUNTER — Other Ambulatory Visit: Payer: 59

## 2021-07-11 ENCOUNTER — Other Ambulatory Visit: Payer: Self-pay

## 2021-07-11 ENCOUNTER — Other Ambulatory Visit (INDEPENDENT_AMBULATORY_CARE_PROVIDER_SITE_OTHER): Payer: 59

## 2021-07-11 DIAGNOSIS — E119 Type 2 diabetes mellitus without complications: Secondary | ICD-10-CM | POA: Diagnosis not present

## 2021-07-11 LAB — COMPREHENSIVE METABOLIC PANEL
ALT: 38 U/L (ref 0–53)
AST: 29 U/L (ref 0–37)
Albumin: 4.3 g/dL (ref 3.5–5.2)
Alkaline Phosphatase: 73 U/L (ref 39–117)
BUN: 19 mg/dL (ref 6–23)
CO2: 26 mEq/L (ref 19–32)
Calcium: 9.4 mg/dL (ref 8.4–10.5)
Chloride: 98 mEq/L (ref 96–112)
Creatinine, Ser: 0.76 mg/dL (ref 0.40–1.50)
GFR: 98.16 mL/min (ref >60.00)
Glucose, Bld: 233 mg/dL — ABNORMAL HIGH (ref 70–99)
Potassium: 4.2 mEq/L (ref 3.5–5.1)
Sodium: 135 mEq/L (ref 135–145)
Total Bilirubin: 0.7 mg/dL (ref 0.2–1.2)
Total Protein: 7.3 g/dL (ref 6.0–8.3)

## 2021-07-11 LAB — LDL CHOLESTEROL, DIRECT: Direct LDL: 53 mg/dL

## 2021-07-11 LAB — LIPID PANEL
Cholesterol: 122 mg/dL (ref 0–200)
HDL: 39.9 mg/dL (ref >39.00)
NonHDL: 82.57
Total CHOL/HDL Ratio: 3
Triglycerides: 235 mg/dL — ABNORMAL HIGH (ref 0.0–149.0)
VLDL: 47 mg/dL — ABNORMAL HIGH (ref 0.0–40.0)

## 2021-07-11 LAB — MICROALBUMIN / CREATININE URINE RATIO
Creatinine,U: 144.8 mg/dL
Microalb Creat Ratio: 0.8 mg/g (ref 0.0–30.0)
Microalb, Ur: 1.2 mg/dL (ref 0.0–1.9)

## 2021-07-30 ENCOUNTER — Other Ambulatory Visit: Payer: Self-pay | Admitting: Family

## 2021-07-30 DIAGNOSIS — I10 Essential (primary) hypertension: Secondary | ICD-10-CM

## 2021-08-15 ENCOUNTER — Other Ambulatory Visit: Payer: Self-pay | Admitting: Family

## 2021-08-15 DIAGNOSIS — I10 Essential (primary) hypertension: Secondary | ICD-10-CM

## 2021-08-16 ENCOUNTER — Telehealth: Payer: Self-pay | Admitting: Family

## 2021-08-16 NOTE — Telephone Encounter (Signed)
Pt called in regards to medication refill for rybelsus. Pt was advised medication refill was sent in today 11/17 and to call back if he has any trouble.

## 2021-08-16 NOTE — Telephone Encounter (Signed)
I called patient to advise that Rybelsus sent this morning at 9:15. He said that he would call [facility] & if any issues he would let us know.

## 2021-09-10 ENCOUNTER — Other Ambulatory Visit: Payer: Self-pay | Admitting: Family

## 2021-09-17 ENCOUNTER — Other Ambulatory Visit: Payer: Self-pay

## 2021-09-17 ENCOUNTER — Ambulatory Visit: Payer: 59 | Admitting: Family

## 2021-09-17 ENCOUNTER — Encounter: Payer: Self-pay | Admitting: Family

## 2021-09-17 VITALS — BP 128/62 | HR 104 | Temp 98.6°F | Ht 72.0 in | Wt 227.6 lb

## 2021-09-17 DIAGNOSIS — E782 Mixed hyperlipidemia: Secondary | ICD-10-CM

## 2021-09-17 DIAGNOSIS — I10 Essential (primary) hypertension: Secondary | ICD-10-CM | POA: Diagnosis not present

## 2021-09-17 DIAGNOSIS — K130 Diseases of lips: Secondary | ICD-10-CM | POA: Insufficient documentation

## 2021-09-17 DIAGNOSIS — Z23 Encounter for immunization: Secondary | ICD-10-CM

## 2021-09-17 DIAGNOSIS — E119 Type 2 diabetes mellitus without complications: Secondary | ICD-10-CM

## 2021-09-17 LAB — POCT GLYCOSYLATED HEMOGLOBIN (HGB A1C): Hemoglobin A1C: 9 % — AB (ref 4.0–5.6)

## 2021-09-17 MED ORDER — RYBELSUS 7 MG PO TABS: 7.0000 mg | ORAL_TABLET | Freq: Every day | ORAL | 1 refills | Status: DC

## 2021-09-17 MED ORDER — CLOTRIMAZOLE 10 MG MT TROC: 10.0000 mg | Freq: Three times a day (TID) | OROMUCOSAL | 0 refills | Status: AC

## 2021-09-17 MED ORDER — CLOTRIMAZOLE 1 % EX OINT: 1.0000 "application " | TOPICAL_OINTMENT | Freq: Two times a day (BID) | CUTANEOUS | 1 refills | Status: DC

## 2021-09-17 NOTE — Assessment & Plan Note (Signed)
Symptoms most consistent with chelitis, concern for risk of coinfection candida due to inhaler use, wearing of dentures.  Trial topical ointment clotrimazole ointment for the corners of his mouth and also provided him with clotrimazole troche to treat for yeast.  Counseled on the importance of rinsing after he uses inhalers.  He will let me know how he is doing

## 2021-09-17 NOTE — Assessment & Plan Note (Signed)
Chronic, improved.  Continue telmisartan 40mg ,hctz 12.5mg , amlodipine 10mg 

## 2021-09-17 NOTE — Progress Notes (Signed)
Subjective:    Patient ID: Thomas Brandt, male    DOB: Nov 13, 1960, 60 y.o.   MRN: [id number]  CC: Thomas Brandt is a 60 y.o. male who presents today for follow up.   HPI: He endorses dietary dietary indiscretion. He drinks 3-4 sodas per day.   He endorses eating more carbs.   He breathing better since he quit smoking;h e would like to eventually come off his inhalers. Compliant with spirva and symbicort. He doesn't rinse his mojuth He complains of cracked lips in the corners.  He has been using nystatin triamcinolone cream which was given to him from his dentist.  The cream is white and he would prefer something less noticeable. he wears dentures.  No white areas in his mouth however he does feel like dryness.   HTN-compliant with telmisartan 40mg ,hctz 12.5mg , amlodipine 10mg . No cp, sob.   DM-compliant with rybelsus 3mg , continue metformin 1000mg  bid  HLD-compliant with Crestor 40 mg HISTORY:  Past Medical History:  Diagnosis Date   Bell's palsy    60 years old   Hyperlipidemia    Hypertension    Stroke (HCC) 2018   No past surgical history on file. Family History  Problem Relation Age of Onset   Heart disease Father    Thyroid cancer Neg Hx     Allergies: Patient has no known allergies. Current Outpatient Medications on File Prior to Visit  Medication Sig Dispense Refill   amLODipine (NORVASC) 10 MG tablet TAKE 1 TABLET BY MOUTH EVERY DAY 90 tablet 1   aspirin 81 MG chewable tablet Chew by mouth daily.     budesonide-formoterol (SYMBICORT) 160-4.5 MCG/ACT inhaler TAKE 2 PUFFS BY MOUTH TWICE A DAY 30.6 each 3   cetirizine (ZYRTEC) 10 MG tablet TAKE 1 TABLET BY MOUTH EVERY DAY 90 tablet 3   hydrochlorothiazide (MICROZIDE) 12.5 MG capsule TAKE 1 CAPSULE BY MOUTH EVERY DAY 90 capsule 1   metFORMIN (GLUCOPHAGE) 1000 MG tablet TAKE 1 TABLET (1,000 MG TOTAL) BY MOUTH 2 (TWO) TIMES DAILY WITH A MEAL. 180 tablet 3   montelukast (SINGULAIR) 10 MG tablet TAKE 1 TABLET BY  MOUTH EVERYDAY AT BEDTIME 90 tablet 1   potassium chloride (KLOR-CON) 10 MEQ tablet TAKE 1 TABLET (10 MEQ TOTAL) BY MOUTH EVERY OTHER DAY. 45 tablet 2   rosuvastatin (CRESTOR) 20 MG tablet TAKE 1 TABLET BY MOUTH EVERY DAY 90 tablet 1   SPIRIVA RESPIMAT 2.5 MCG/ACT AERS INHALE 2 PUFFS BY MOUTH INTO THE LUNGS DAILY 4 g 3   telmisartan (MICARDIS) 40 MG tablet TAKE 1 TABLET BY MOUTH EVERY DAY 90 tablet 1   Current Facility-Administered Medications on File Prior to Visit  Medication Dose Route Frequency Provider Last Rate Last Admin   albuterol (PROVENTIL) (2.5 MG/3ML) 0.083% nebulizer solution 2.5 mg  2.5 mg Nebulization Once , MD        Social History   Tobacco Use   Smoking status: Former    Packs/day: 0.75    Years: 39.00    Pack years: 29.25    Types: Cigarettes    Start date: 02/29/1980    Quit date: 04/30/2020    Years since quitting: 1.3   Smokeless tobacco: Never   Tobacco comments:    3-4 cigarettes a day currently  Vaping Use   Vaping Use: Some days    Review of Systems  Constitutional:  Negative for chills and fever.  HENT:  Negative for sore throat and trouble swallowing.  Respiratory:  Negative for cough, shortness of breath and wheezing.   Cardiovascular:  Negative for chest pain and palpitations.  Gastrointestinal:  Negative for nausea and vomiting.  Skin:  Positive for rash.     Objective:    BP 128/62 (BP Location: Left Arm, Patient Position: Sitting, Cuff Size: Large)    Pulse (!) 104    Temp 98.6 F (37 C) (Oral)    Ht 6' (1.829 m)    Wt 227 lb 9.6 oz (103.2 kg)    SpO2 95%    BMI 30.87 kg/m  BP Readings from Last 3 Encounters:  09/17/21 128/62  06/18/21 123/64  02/05/21 (!) 142/70   Wt Readings from Last 3 Encounters:  09/17/21 227 lb 9.6 oz (103.2 kg)  06/18/21 226 lb 6.4 oz (102.7 kg)  02/05/21 230 lb 6.4 oz (104.5 kg)    Physical Exam Vitals reviewed.  Constitutional:      Appearance: He is well-developed.  HENT:     Head:       Comments: Fissuring , erythematous and scaling vermillion border.  Cardiovascular:     Rate and Rhythm: Regular rhythm.     Heart sounds: Normal heart sounds.  Pulmonary:     Effort: Pulmonary effort is normal. No respiratory distress.     Breath sounds: Normal breath sounds. No wheezing, rhonchi or rales.  Skin:    General: Skin is warm and dry.  Neurological:     Mental Status: He is alert.  Psychiatric:        Speech: Speech normal.        Behavior: Behavior normal.       Assessment & Plan:   Problem List Items Addressed This Visit       Cardiovascular and Mediastinum   Essential hypertension    Chronic, improved.  Continue telmisartan 40mg ,hctz 12.5mg , amlodipine 10mg         Digestive   Cheilitis    Symptoms most consistent with chelitis, concern for risk of coinfection candida due to inhaler use, wearing of dentures.  Trial topical ointment clotrimazole ointment for the corners of his mouth and also provided him with clotrimazole troche to treat for yeast.  Counseled on the importance of rinsing after he uses inhalers.  He will let me know how he is doing      Relevant Medications   clotrimazole (MYCELEX) 10 MG troche   Clotrimazole 1 % OINT     Endocrine   Type 2 diabetes mellitus without complication, without long-term current use of insulin (HCC) - Primary    Lab Results  Component Value Date   HGBA1C 9.0 (A) 09/17/2021  Uncontrolled.  Long discussion regards to dietary discretion and counseled on importance of stopping soda use and using sugar-free drinks in its place.  Increase Rybelsus to 7 mg.  Continue metformin 1000mg  bid.       Relevant Medications   Semaglutide (RYBELSUS) 7 MG TABS   Other Relevant Orders   POCT HgB A1C (Completed)     Other   Mixed hyperlipidemia    Chronic, stable.  LDL less than 70.  Continue Crestor 40 mg      Other Visit Diagnoses     Need for immunization against influenza       Relevant Orders   Flu Vaccine  QUAD 39mo+IM (Fluarix, Fluzone & Alfiuria Quad PF) (Completed)        I have discontinued 09/19/2021 Shatz's Rybelsus. I am also having him start on Rybelsus, clotrimazole, and  Clotrimazole. Additionally, I am having him maintain his aspirin, montelukast, cetirizine, potassium chloride, budesonide-formoterol, metFORMIN, rosuvastatin, hydrochlorothiazide, telmisartan, amLODipine, and Spiriva Respimat.   Meds ordered this encounter  Medications   Semaglutide (RYBELSUS) 7 MG TABS    Sig: Take 7 mg by mouth daily.    Dispense:  90 tablet    Refill:  1    Order Specific Question:   Supervising Provider    Answer:   Sherlene Shams [[id number]]   clotrimazole (MYCELEX) 10 MG troche    Sig: Take 1 tablet (10 mg total) by mouth 3 (three) times daily for 5 days. Dissolve TID    Dispense:  15 tablet    Refill:  0    Order Specific Question:   Supervising Provider    Answer:   Duncan Dull L [[id number]]   Clotrimazole 1 % OINT    Sig: Apply 1 application topically 2 (two) times daily.    Dispense:  56.7 g    Refill:  1    Order Specific Question:   Supervising Provider    Answer:   Sherlene Shams [[id number]]    Return precautions given.   Risks, benefits, and alternatives of the medications and treatment plan prescribed today were discussed, and patient expressed understanding.   Education regarding symptom management and diagnosis given to patient on AVS.  Continue to follow with Allegra Grana, FNP for routine health maintenance.   Lucky Rathke and I agreed with plan.   Rennie Plowman, FNP

## 2021-09-17 NOTE — Assessment & Plan Note (Signed)
Lab Results  Component Value Date   HGBA1C 9.0 (A) 09/17/2021  Uncontrolled.  Long discussion regards to dietary discretion and counseled on importance of stopping soda use and using sugar-free drinks in its place.  Increase Rybelsus to 7 mg.  Continue metformin 1000mg  bid.

## 2021-09-17 NOTE — Assessment & Plan Note (Signed)
Chronic, stable.  LDL less than 70.  Continue Crestor 40 mg

## 2021-09-17 NOTE — Patient Instructions (Signed)
Increase Rybelsus to 7 mg  As discussed, please ensure that you are rinsing your mouth after you use either one of your inhalers.It's important to rinse your mouth out after using a steroid inhaler.This is so that any medicine that is stuck in your mouth or throat is cleaned away. This will prevent side effects such as oral thrush.  I will treat with topical antifungal ointment that you can put in the creases of your lips and also oral dissolvable tablets to treat for suspected thrush due to inhaler use and/or dentures.

## 2021-09-18 ENCOUNTER — Other Ambulatory Visit: Payer: Self-pay

## 2021-09-18 DIAGNOSIS — E119 Type 2 diabetes mellitus without complications: Secondary | ICD-10-CM

## 2021-09-18 DIAGNOSIS — K130 Diseases of lips: Secondary | ICD-10-CM

## 2021-09-18 DIAGNOSIS — I10 Essential (primary) hypertension: Secondary | ICD-10-CM

## 2021-09-18 MED ORDER — RYBELSUS 7 MG PO TABS: 7.0000 mg | ORAL_TABLET | Freq: Every day | ORAL | 1 refills | Status: DC

## 2021-09-18 MED ORDER — CLOTRIMAZOLE 1 % EX OINT: 1.0000 "application " | TOPICAL_OINTMENT | Freq: Two times a day (BID) | CUTANEOUS | 1 refills | Status: AC

## 2021-09-24 ENCOUNTER — Other Ambulatory Visit: Payer: Self-pay | Admitting: Family

## 2021-09-24 DIAGNOSIS — I10 Essential (primary) hypertension: Secondary | ICD-10-CM

## 2021-10-23 ENCOUNTER — Telehealth: Payer: Self-pay | Admitting: Family

## 2021-10-23 NOTE — Telephone Encounter (Signed)
Call pt  Due for DM annual eye exam If done, please request result from ophthalmology If not done or patient needs referral for annual DM eye exam, please place

## 2021-10-24 NOTE — Telephone Encounter (Signed)
FYI I was able to schedule patient for DM eye exam 2/18. He is also scheduled with you on Friday for left sided pain since start Rybelsus at last visit. He stated that it started then but he has had a kidney infection before that felt like this.

## 2021-10-25 IMAGING — DX DG ANKLE COMPLETE 3+V*L*
3 series · 3 of 3 positions shown · non-contrast
Comparison: None.

CLINICAL DATA: Pain

EXAM:
LEFT ANKLE COMPLETE - 3+ VIEW

[ankle ap]
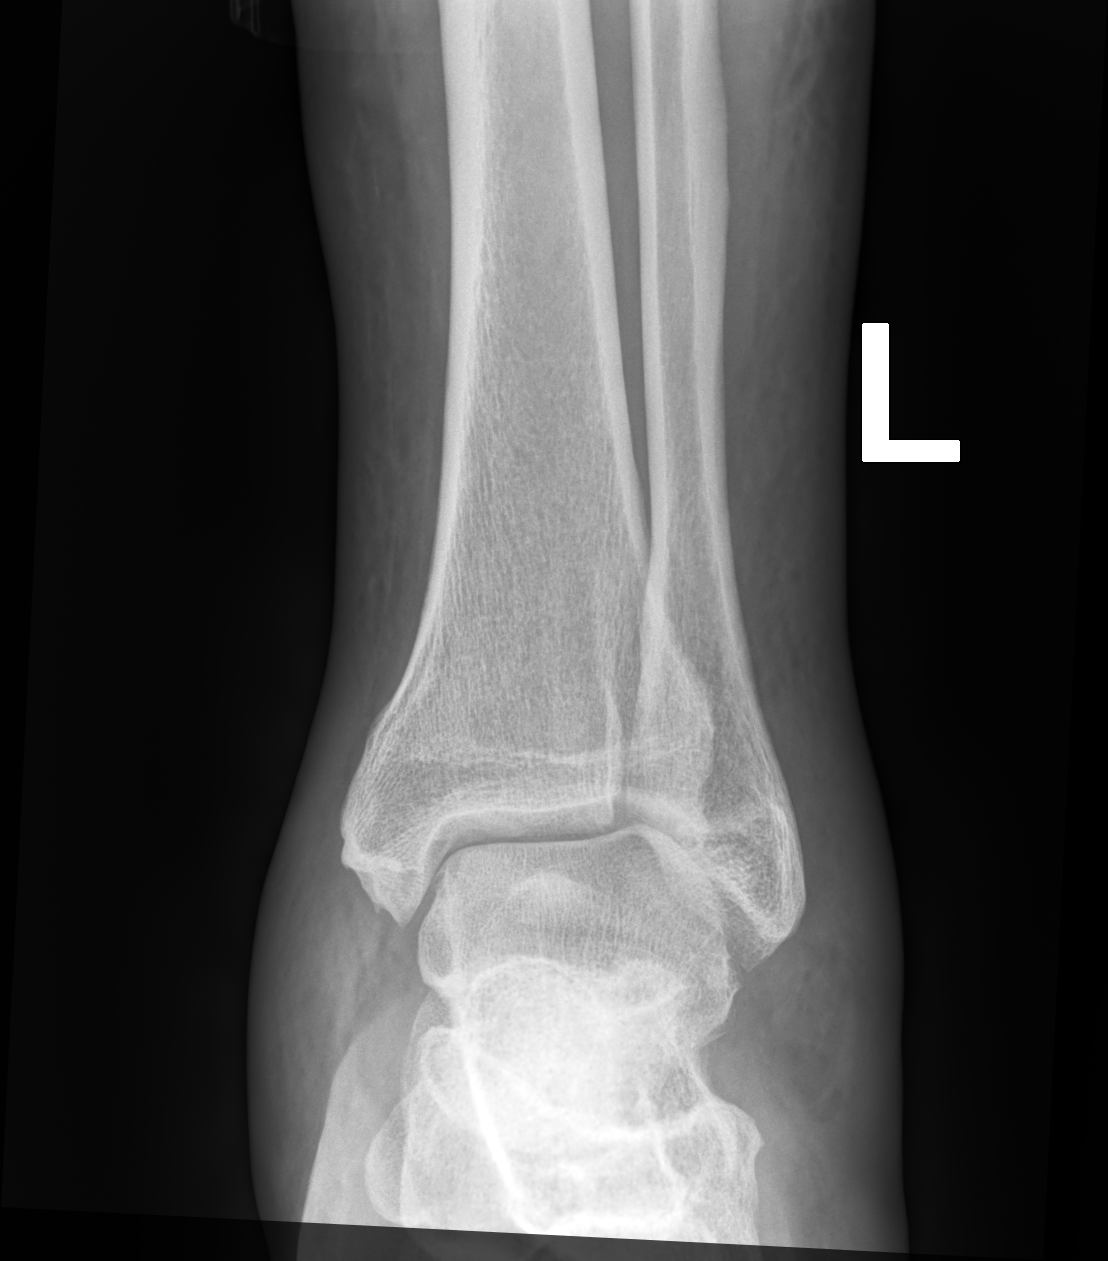

[ankle mlo]
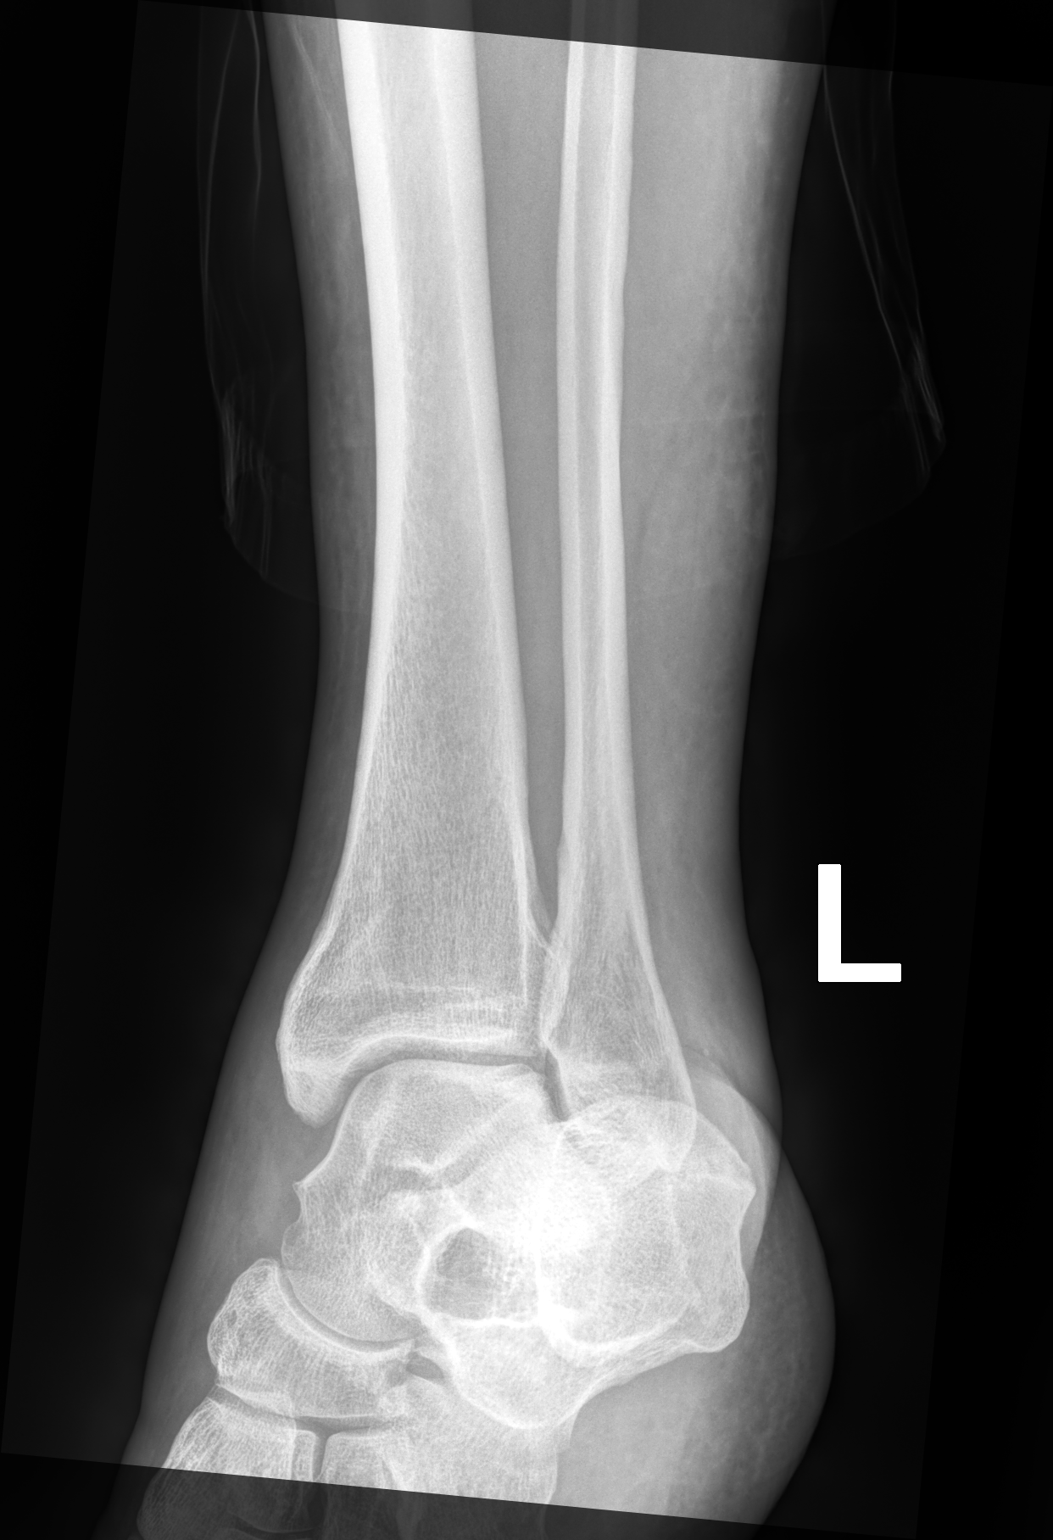

[ankle lat]
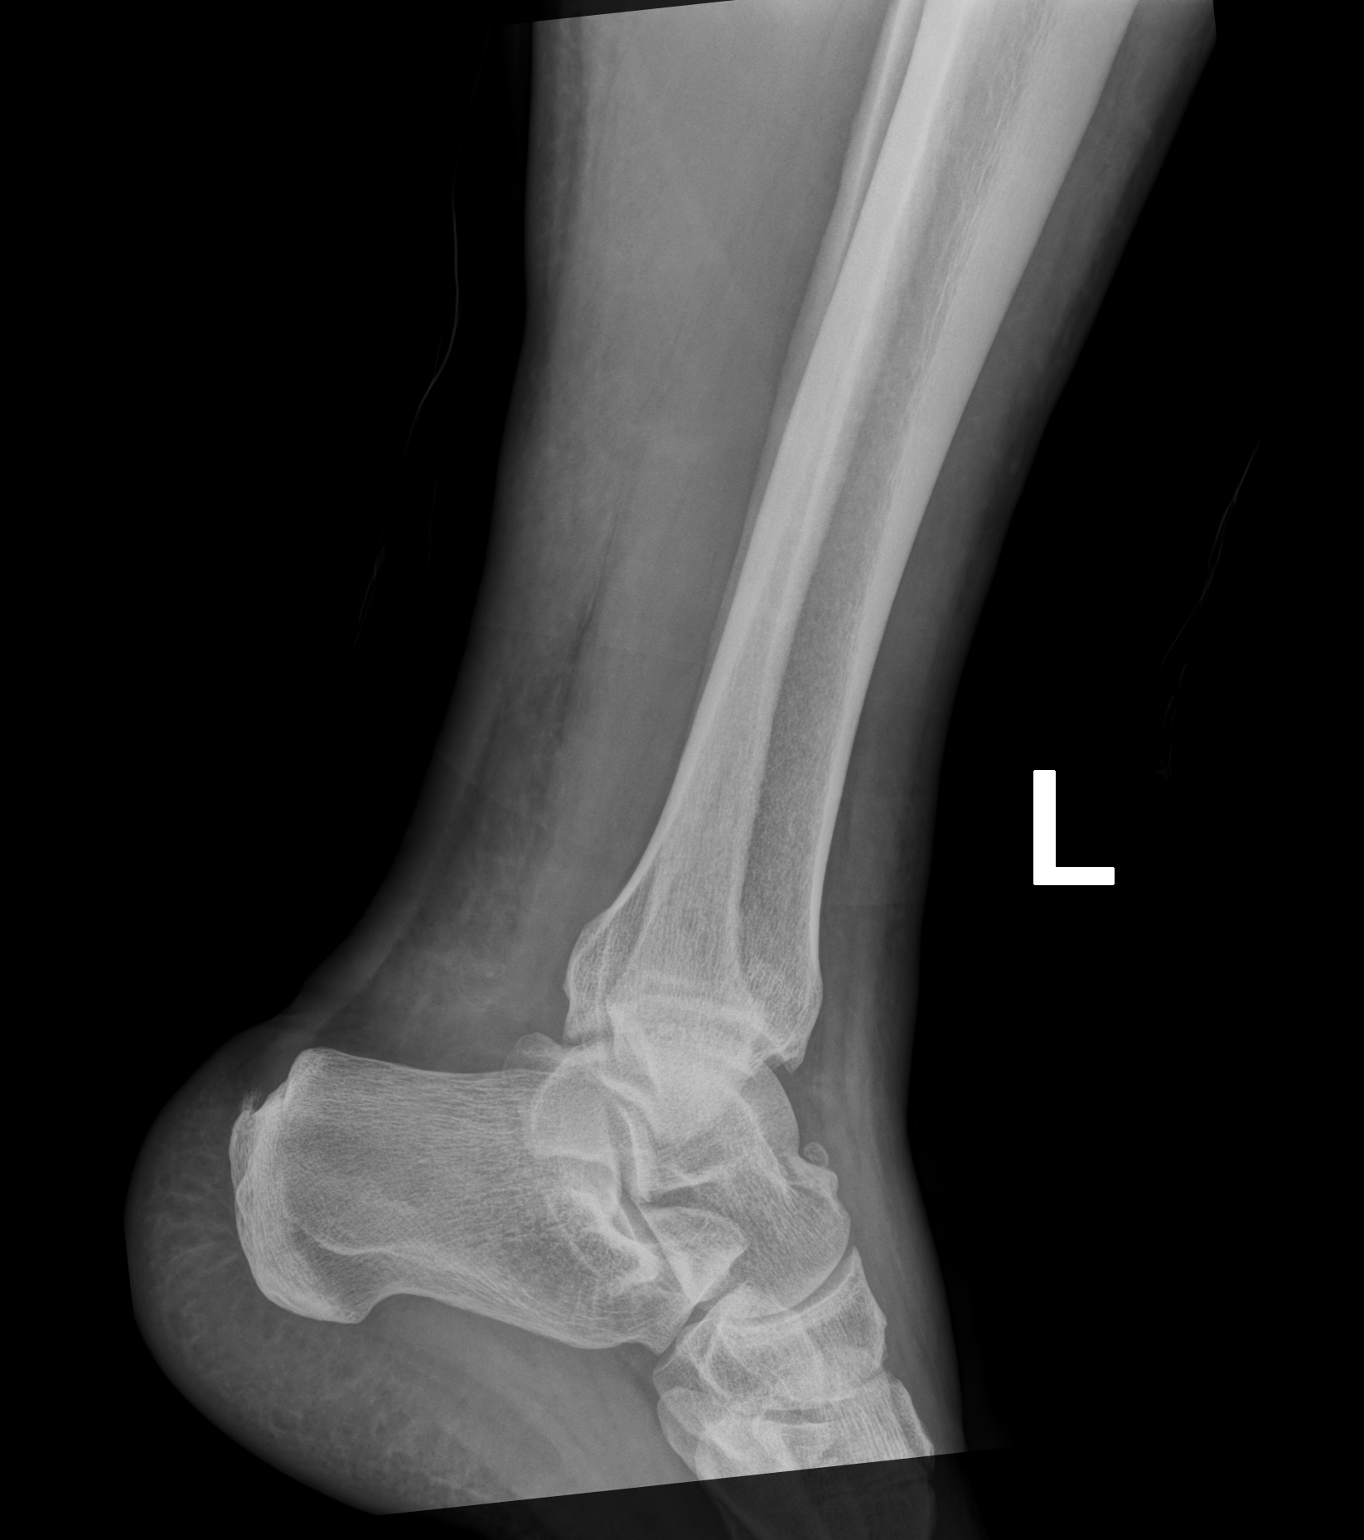

[3 of 3 positions shown; findings below may reference images not displayed]

FINDINGS: There is soft tissue swelling about the ankle. There is no acute
displaced fracture or dislocation. Mild degenerative changes are
noted.
IMPRESSION: No acute displaced fracture or dislocation. Soft tissue swelling
about the ankle.

## 2021-10-26 ENCOUNTER — Ambulatory Visit: Payer: 59 | Admitting: Family

## 2021-10-28 ENCOUNTER — Other Ambulatory Visit: Payer: Self-pay | Admitting: Family

## 2021-10-28 DIAGNOSIS — E782 Mixed hyperlipidemia: Secondary | ICD-10-CM

## 2021-10-31 ENCOUNTER — Other Ambulatory Visit: Payer: Self-pay

## 2021-10-31 ENCOUNTER — Encounter: Payer: Self-pay | Admitting: Family

## 2021-10-31 ENCOUNTER — Ambulatory Visit: Payer: 59 | Admitting: Family

## 2021-10-31 VITALS — BP 134/80 | HR 83 | Temp 98.6°F | Ht 72.0 in | Wt 227.4 lb

## 2021-10-31 DIAGNOSIS — G8929 Other chronic pain: Secondary | ICD-10-CM

## 2021-10-31 DIAGNOSIS — E119 Type 2 diabetes mellitus without complications: Secondary | ICD-10-CM | POA: Diagnosis not present

## 2021-10-31 DIAGNOSIS — R140 Abdominal distension (gaseous): Secondary | ICD-10-CM | POA: Diagnosis not present

## 2021-10-31 DIAGNOSIS — M545 Low back pain, unspecified: Secondary | ICD-10-CM

## 2021-10-31 DIAGNOSIS — I10 Essential (primary) hypertension: Secondary | ICD-10-CM | POA: Diagnosis not present

## 2021-10-31 DIAGNOSIS — Z1211 Encounter for screening for malignant neoplasm of colon: Secondary | ICD-10-CM | POA: Diagnosis not present

## 2021-10-31 NOTE — Patient Instructions (Signed)
As discussed, please reduce Rybelsus at 3 mg.  If symptoms do not abruptly resolve, please eliminate Rybelsus and call to let me know.  In that case, we may have to add another medication to control your diabetes and order abdominal CT as we discussed.  Continue metformin 1000 mg twice daily.    I placed a referral for colonoscopy  Let us know if you dont hear back within a week in regards to an appointment being scheduled.

## 2021-10-31 NOTE — Progress Notes (Signed)
Subjective:    Patient ID: Thomas Brandt, male    DOB: 10/14/1960, 61 y.o.   MRN: [id number]  CC: Thomas Brandt is a 61 y.o. male who presents today for follow up.   HPI: Complains of right low back pain , off and on for years. Pain is generally present all the time. Exacerbated by movements, pick up heavy object No numbness, groin pain.   He has had chronic low back pain for years and low pain feels similar to prior.   He also complains of new abdominal distention, constipation x 6 weeks.   Onset with increased rybelsus dose.   Symptoms will improve with BM.   Symptoms associated with less frequent BM and bloating, gas,belching. Prior to rybelsus, he would have a bowel movement daily. Yesterday BM which was soft and brown, non bloody.  No fever, vomiting, dysuria, hematuria, change in stool diameter  No blood in stool.  H/o pyelonephritis.     DM- He has reduced drinking pepsi significantly with much lower blood sugar. Post prandial 2 hours after eat 138.  Compliant with Rybelsus 7 mg,metformin 1000mg  bid.    Eye exam is scheduled for this month  HTN- compliant with telmisartan 40mg ,hctz 12.5mg , amlodipine 10mg . No cp.  HISTORY:  Past Medical History:  Diagnosis Date   Bell's palsy    61 years old   Hyperlipidemia    Hypertension    Stroke ([place]) 2018   History reviewed. No pertinent surgical history. Family History  Problem Relation Age of Onset   Heart disease Father    Thyroid cancer Neg Hx     Allergies: Patient has no known allergies. Current Outpatient Medications on File Prior to Visit  Medication Sig Dispense Refill   amLODipine (NORVASC) 10 MG tablet TAKE 1 TABLET BY MOUTH EVERY DAY 90 tablet 1   aspirin 81 MG chewable tablet Chew by mouth daily.     cetirizine (ZYRTEC) 10 MG tablet TAKE 1 TABLET BY MOUTH EVERY DAY 90 tablet 3   Clotrimazole 1 % OINT Apply 1 application topically 2 (two) times daily. 56.7 g 1   hydrochlorothiazide (MICROZIDE)  12.5 MG capsule TAKE 1 CAPSULE BY MOUTH EVERY DAY 90 capsule 1   metFORMIN (GLUCOPHAGE) 1000 MG tablet TAKE 1 TABLET (1,000 MG TOTAL) BY MOUTH 2 (TWO) TIMES DAILY WITH A MEAL. 180 tablet 3   montelukast (SINGULAIR) 10 MG tablet TAKE 1 TABLET BY MOUTH EVERYDAY AT BEDTIME 90 tablet 1   potassium chloride (KLOR-CON) 10 MEQ tablet TAKE 1 TABLET (10 MEQ TOTAL) BY MOUTH EVERY OTHER DAY. 45 tablet 2   rosuvastatin (CRESTOR) 20 MG tablet TAKE 1 TABLET BY MOUTH EVERY DAY 90 tablet 1   Semaglutide (RYBELSUS) 7 MG TABS Take 7 mg by mouth daily. 90 tablet 1   SPIRIVA RESPIMAT 2.5 MCG/ACT AERS INHALE 2 PUFFS BY MOUTH INTO THE LUNGS DAILY 4 g 3   telmisartan (MICARDIS) 40 MG tablet TAKE 1 TABLET BY MOUTH EVERY DAY 90 tablet 1   Current Facility-Administered Medications on File Prior to Visit  Medication Dose Route Frequency Provider Last Rate Last Admin   albuterol (PROVENTIL) (2.5 MG/3ML) 0.083% nebulizer solution 2.5 mg  2.5 mg Nebulization Once Tyler Pita, MD        Social History   Tobacco Use   Smoking status: Former    Packs/day: 0.75    Years: 39.00    Pack years: 29.25    Types: Cigarettes    Start date: 02/29/1980  Quit date: 04/30/2020    Years since quitting: 1.5   Smokeless tobacco: Never   Tobacco comments:    3-4 cigarettes a day currently  Vaping Use   Vaping Use: Some days    Review of Systems  Constitutional:  Negative for chills and fever.  HENT:  Negative for congestion.   Respiratory:  Negative for cough.   Cardiovascular:  Negative for chest pain, palpitations and leg swelling.  Gastrointestinal:  Positive for abdominal distention and constipation. Negative for blood in stool, diarrhea, nausea and vomiting.  Musculoskeletal:  Negative for myalgias.  Skin:  Negative for rash.  Neurological:  Negative for headaches.  Hematological:  Negative for adenopathy.  Psychiatric/Behavioral:  Negative for confusion.      Objective:    BP 134/80 (BP Location: Left Arm,  Patient Position: Sitting, Cuff Size: Large)    Pulse 83    Temp 98.6 F (37 C) (Oral)    Ht 6' (1.829 m)    Wt 227 lb 6.4 oz (103.1 kg)    SpO2 97%    BMI 30.84 kg/m  BP Readings from Last 3 Encounters:  10/31/21 134/80  09/17/21 128/62  06/18/21 123/64   Wt Readings from Last 3 Encounters:  10/31/21 227 lb 6.4 oz (103.1 kg)  09/17/21 227 lb 9.6 oz (103.2 kg)  06/18/21 226 lb 6.4 oz (102.7 kg)    Physical Exam Vitals reviewed.  Constitutional:      Appearance: Normal appearance. He is well-developed.  Cardiovascular:     Rate and Rhythm: Regular rhythm.     Heart sounds: Normal heart sounds.  Pulmonary:     Effort: Pulmonary effort is normal. No respiratory distress.     Breath sounds: Normal breath sounds. No wheezing, rhonchi or rales.  Abdominal:     General: Bowel sounds are normal. There is distension.     Palpations: Abdomen is soft. Abdomen is not rigid. There is no fluid wave or mass.     Tenderness: There is no abdominal tenderness. There is no right CVA tenderness, left CVA tenderness, guarding or rebound. Negative signs include Murphy's sign and McBurney's sign.  Musculoskeletal:     Lumbar back: No swelling, spasms or tenderness. Normal range of motion.     Comments: Full range of motion with flexion, extension, lateral side bends. No rash.  Skin:    General: Skin is warm and dry.  Neurological:     Mental Status: He is alert.  Psychiatric:        Speech: Speech normal.        Behavior: Behavior normal.       Assessment & Plan:   Problem List Items Addressed This Visit       Cardiovascular and Mediastinum   Essential hypertension    Chronic, stable.  Continue compliant with telmisartan 40mg ,hctz 12.5mg , amlodipine 10mg         Endocrine   Type 2 diabetes mellitus without complication, without long-term current use of insulin ([place])    Anticipate improved.  Patient is not tolerating Rybelsus abdominal distention, constipation after increased dose.  Will  reduce dose back to 3 mg.  If symptoms do not completely resolve, we will stop Rybelsus altogether.  He will continue metformin 1000 mg twice daily        Other   Abdominal distension - Primary    Patient is afebrile.  No guarding on exam and in no acute distress.  He was willing to have colonoscopy which I have placed.  I advised abdominal imaging today and he declined x-ray, abdominal CT.  He thinks symptoms are related to Rybelsus which I think is most likely.  He understands imaging would rule out abdominal mass, renal stone, liver disease.  He will let me know if he would like to proceed with imaging.      Relevant Orders   Ambulatory referral to Gastroenterology   Low back pain    No pain today or elicited on exam.  Advised baseline lumbar and thoracic x-rays to start.  Patient  Declines as he is not particular bothered by his back pain at this time.  He will let me know if symptoms were to escalate or new symptoms develop      Other Visit Diagnoses     Screen for colon cancer       Relevant Orders   Ambulatory referral to Gastroenterology        I have discontinued Legrand Como E. Baskette's budesonide-formoterol. I am also having him maintain his aspirin, montelukast, cetirizine, metFORMIN, hydrochlorothiazide, telmisartan, amLODipine, Spiriva Respimat, Rybelsus, Clotrimazole, potassium chloride, and rosuvastatin.   No orders of the defined types were placed in this encounter.   Return precautions given.   Risks, benefits, and alternatives of the medications and treatment plan prescribed today were discussed, and patient expressed understanding.   Education regarding symptom management and diagnosis given to patient on AVS.  Continue to follow with Burnard Hawthorne, FNP for routine health maintenance.   Christen Butter and I agreed with plan.   Mable Paris, FNP

## 2021-11-01 DIAGNOSIS — M545 Low back pain, unspecified: Secondary | ICD-10-CM | POA: Insufficient documentation

## 2021-11-01 NOTE — Assessment & Plan Note (Signed)
Chronic, stable.  Continue compliant with telmisartan 40mg ,hctz 12.5mg , amlodipine 10mg 

## 2021-11-01 NOTE — Assessment & Plan Note (Signed)
No pain today or elicited on exam.  Advised baseline lumbar and thoracic x-rays to start.  Patient  Declines as he is not particular bothered by his back pain at this time.  He will let me know if symptoms were to escalate or new symptoms develop

## 2021-11-01 NOTE — Assessment & Plan Note (Signed)
Anticipate improved.  Patient is not tolerating Rybelsus abdominal distention, constipation after increased dose.  Will reduce dose back to 3 mg.  If symptoms do not completely resolve, we will stop Rybelsus altogether.  He will continue metformin 1000 mg twice daily

## 2021-11-01 NOTE — Assessment & Plan Note (Signed)
Patient is afebrile.  No guarding on exam and in no acute distress.  He was willing to have colonoscopy which I have placed.  I advised abdominal imaging today and he declined x-ray, abdominal CT.  He thinks symptoms are related to Rybelsus which I think is most likely.  He understands imaging would rule out abdominal mass, renal stone, liver disease.  He will let me know if he would like to proceed with imaging.

## 2021-11-02 ENCOUNTER — Telehealth: Payer: Self-pay

## 2021-11-02 NOTE — Telephone Encounter (Signed)
PATIENT REFUSED SERVICE SAID HE DONT NEED THE APPOINTMENT

## 2021-11-17 LAB — HM DIABETES EYE EXAM

## 2021-11-28 ENCOUNTER — Other Ambulatory Visit: Payer: Self-pay

## 2021-11-28 ENCOUNTER — Encounter: Payer: Self-pay | Admitting: Family

## 2021-11-28 ENCOUNTER — Ambulatory Visit: Payer: 59 | Admitting: Family

## 2021-11-28 VITALS — BP 120/70 | HR 96 | Temp 96.7°F | Ht 73.0 in | Wt 229.4 lb

## 2021-11-28 DIAGNOSIS — E119 Type 2 diabetes mellitus without complications: Secondary | ICD-10-CM

## 2021-11-28 NOTE — Patient Instructions (Addendum)
Call [facility] and state that you DO want colonoscopy so they can re open referral, Phone: [phone] ? ?Nice to see you! ? ? ? ?

## 2021-11-28 NOTE — Assessment & Plan Note (Signed)
Based on postprandial glucose, anticipate tremendous improvement.  A1c due in 4 weeks.  I have ordered A1c.  As abdominal distention pain completely resolved, he is remained on Rybelsus 7 mg.  We will continue at this time.  Continue metformin 1000 mg twice per day.  He declines any further evaluation or work-up for prior abdominal distention. ?

## 2021-11-28 NOTE — Progress Notes (Signed)
? ?Subjective:  ? ? Patient ID: Thomas Brandt, male    DOB: Sep 23, 1961, 61 y.o.   MRN: [id number] ? ?CC: Thomas Brandt is a 61 y.o. male who presents today for follow up.  ? ?HPI: Follow-up abdominal distention 4 weeks ago, completely resolved. He never reduced Rybelsus back to 3 mg.  Compliant with metformin 1000 mg twice per day ? ?Post prandial 128-160 ?He is no longer drinking soft drinks ? ?No constipation, fever, trouble urinating. ? ?He feels well today.  No new concerns. ? ? ?HISTORY:  ?Past Medical History:  ?Diagnosis Date  ? Bell's palsy   ? 60 years old  ? Hyperlipidemia   ? Hypertension   ? Stroke [facility]) 2018  ? ?No past surgical history on file. ?Family History  ?Problem Relation Age of Onset  ? Heart disease Father   ? Thyroid cancer Neg Hx   ? ? ?Allergies: Patient has no known allergies. ?Current Outpatient Medications on File Prior to Visit  ?Medication Sig Dispense Refill  ? amLODipine (NORVASC) 10 MG tablet TAKE 1 TABLET BY MOUTH EVERY DAY 90 tablet 1  ? aspirin 81 MG chewable tablet Chew by mouth daily.    ? cetirizine (ZYRTEC) 10 MG tablet TAKE 1 TABLET BY MOUTH EVERY DAY 90 tablet 3  ? Clotrimazole 1 % OINT Apply 1 application topically 2 (two) times daily. 56.7 g 1  ? hydrochlorothiazide (MICROZIDE) 12.5 MG capsule TAKE 1 CAPSULE BY MOUTH EVERY DAY 90 capsule 1  ? metFORMIN (GLUCOPHAGE) 1000 MG tablet TAKE 1 TABLET (1,000 MG TOTAL) BY MOUTH 2 (TWO) TIMES DAILY WITH A MEAL. 180 tablet 3  ? montelukast (SINGULAIR) 10 MG tablet TAKE 1 TABLET BY MOUTH EVERYDAY AT BEDTIME 90 tablet 1  ? potassium chloride (KLOR-CON) 10 MEQ tablet TAKE 1 TABLET (10 MEQ TOTAL) BY MOUTH EVERY OTHER DAY. 45 tablet 2  ? rosuvastatin (CRESTOR) 20 MG tablet TAKE 1 TABLET BY MOUTH EVERY DAY 90 tablet 1  ? Semaglutide (RYBELSUS) 7 MG TABS Take 7 mg by mouth daily. 90 tablet 1  ? SPIRIVA RESPIMAT 2.5 MCG/ACT AERS INHALE 2 PUFFS BY MOUTH INTO THE LUNGS DAILY 4 g 3  ? telmisartan (MICARDIS) 40 MG tablet TAKE 1 TABLET BY  MOUTH EVERY DAY 90 tablet 1  ? ?Current Facility-Administered Medications on File Prior to Visit  ?Medication Dose Route Frequency Provider Last Rate Last Admin  ? albuterol (PROVENTIL) (2.5 MG/3ML) 0.083% nebulizer solution 2.5 mg  2.5 mg Nebulization Once Salena Saner, MD      ? ? ?Social History  ? ?Tobacco Use  ? Smoking status: Former  ?  Packs/day: 0.75  ?  Years: 39.00  ?  Pack years: 29.25  ?  Types: Cigarettes  ?  Start date: 02/29/1980  ?  Quit date: 04/30/2020  ?  Years since quitting: 1.5  ? Smokeless tobacco: Never  ? Tobacco comments:  ?  3-4 cigarettes a day currently  ?Vaping Use  ? Vaping Use: Some days  ? ? ?Review of Systems  ?Constitutional:  Negative for chills and fever.  ?Respiratory:  Negative for cough.   ?Cardiovascular:  Negative for chest pain and palpitations.  ?Gastrointestinal:  Negative for abdominal distention (resolved), nausea and vomiting.  ?   ?Objective:  ?  ?BP 120/70 (BP Location: Left Arm, Patient Position: Sitting, Cuff Size: Normal)   Pulse 96   Temp (!) 96.7 ?F (35.9 ?C) (Oral)   Ht 6\' 1"  (1.854 m)   Wt  229 lb 6.4 oz (104.1 kg)   SpO2 97%   BMI 30.27 kg/m?  ?BP Readings from Last 3 Encounters:  ?11/28/21 120/70  ?10/31/21 134/80  ?09/17/21 128/62  ? ?Wt Readings from Last 3 Encounters:  ?11/28/21 229 lb 6.4 oz (104.1 kg)  ?10/31/21 227 lb 6.4 oz (103.1 kg)  ?09/17/21 227 lb 9.6 oz (103.2 kg)  ? ? ?Physical Exam ?Vitals reviewed.  ?Constitutional:   ?   Appearance: He is well-developed.  ?Cardiovascular:  ?   Rate and Rhythm: Regular rhythm.  ?   Heart sounds: Normal heart sounds.  ?Pulmonary:  ?   Effort: Pulmonary effort is normal. No respiratory distress.  ?   Breath sounds: Normal breath sounds. No wheezing, rhonchi or rales.  ?Skin: ?   General: Skin is warm and dry.  ?Neurological:  ?   Mental Status: He is alert.  ?Psychiatric:     ?   Speech: Speech normal.     ?   Behavior: Behavior normal.  ? ? ?   ?Assessment & Plan:  ? ?Problem List Items Addressed This  Visit   ? ?  ? Endocrine  ? Type 2 diabetes mellitus without complication, without long-term current use of insulin ([facility]) - Primary  ?  Based on postprandial glucose, anticipate tremendous improvement.  A1c due in 4 weeks.  I have ordered A1c.  As abdominal distention pain completely resolved, he is remained on Rybelsus 7 mg.  We will continue at this time.  Continue metformin 1000 mg twice per day.  He declines any further evaluation or work-up for prior abdominal distention. ?  ?  ? Relevant Orders  ? Hemoglobin A1c  ? ? ? ?I am having Thomas Brandt maintain his aspirin, montelukast, cetirizine, metFORMIN, hydrochlorothiazide, telmisartan, amLODipine, Spiriva Respimat, Rybelsus, Clotrimazole, potassium chloride, and rosuvastatin. ? ? ?No orders of the defined types were placed in this encounter. ? ? ?Return precautions given.  ? ?Risks, benefits, and alternatives of the medications and treatment plan prescribed today were discussed, and patient expressed understanding.  ? ?Education regarding symptom management and diagnosis given to patient on AVS. ? ?Continue to follow with Allegra Grana, FNP for routine health maintenance.  ? ?Thomas Brandt and I agreed with plan.  ? ?Rennie Plowman, FNP ? ? ?

## 2021-12-24 ENCOUNTER — Other Ambulatory Visit: Payer: Self-pay | Admitting: Family

## 2021-12-27 ENCOUNTER — Other Ambulatory Visit (INDEPENDENT_AMBULATORY_CARE_PROVIDER_SITE_OTHER): Payer: 59

## 2021-12-27 DIAGNOSIS — E119 Type 2 diabetes mellitus without complications: Secondary | ICD-10-CM | POA: Diagnosis not present

## 2021-12-27 LAB — HEMOGLOBIN A1C: Hgb A1c MFr Bld: 7.8 % — ABNORMAL HIGH (ref 4.6–6.5)

## 2022-01-26 ENCOUNTER — Other Ambulatory Visit: Payer: Self-pay | Admitting: Family

## 2022-01-26 DIAGNOSIS — I10 Essential (primary) hypertension: Secondary | ICD-10-CM

## 2022-01-28 ENCOUNTER — Other Ambulatory Visit: Payer: Self-pay

## 2022-01-28 DIAGNOSIS — I10 Essential (primary) hypertension: Secondary | ICD-10-CM

## 2022-01-28 MED ORDER — AMLODIPINE BESYLATE 10 MG PO TABS: 10.0000 mg | ORAL_TABLET | Freq: Every day | ORAL | 3 refills | Status: DC

## 2022-01-28 MED ORDER — TELMISARTAN 40 MG PO TABS: 40.0000 mg | ORAL_TABLET | Freq: Every day | ORAL | 3 refills | Status: DC

## 2022-01-29 ENCOUNTER — Telehealth: Payer: Self-pay | Admitting: Family

## 2022-01-29 NOTE — Telephone Encounter (Signed)
Patient is requesting a refill on his SPIRIVA RESPIMAT 2.5 MCG/ACT AERS. Pharmacy is [facility] on [address], [place]. ?

## 2022-01-30 ENCOUNTER — Other Ambulatory Visit: Payer: Self-pay

## 2022-01-30 MED ORDER — SPIRIVA RESPIMAT 2.5 MCG/ACT IN AERS: INHALATION_SPRAY | RESPIRATORY_TRACT | 3 refills | Status: DC

## 2022-01-30 NOTE — Telephone Encounter (Signed)
Medication refilled

## 2022-03-01 ENCOUNTER — Ambulatory Visit: Payer: 59 | Admitting: Family

## 2022-03-02 ENCOUNTER — Other Ambulatory Visit: Payer: Self-pay | Admitting: Family

## 2022-03-02 DIAGNOSIS — E119 Type 2 diabetes mellitus without complications: Secondary | ICD-10-CM

## 2022-03-02 DIAGNOSIS — J449 Chronic obstructive pulmonary disease, unspecified: Secondary | ICD-10-CM

## 2022-03-04 ENCOUNTER — Encounter: Payer: Self-pay | Admitting: Family

## 2022-03-04 ENCOUNTER — Ambulatory Visit: Payer: 59 | Admitting: Family

## 2022-03-04 VITALS — BP 135/65 | HR 78 | Temp 98.1°F | Ht 72.0 in | Wt 224.8 lb

## 2022-03-04 DIAGNOSIS — E119 Type 2 diabetes mellitus without complications: Secondary | ICD-10-CM

## 2022-03-04 DIAGNOSIS — I10 Essential (primary) hypertension: Secondary | ICD-10-CM

## 2022-03-04 NOTE — Progress Notes (Signed)
Taking both Spiriva and Symbicort. NO FOOT EXAM today

## 2022-03-04 NOTE — Assessment & Plan Note (Addendum)
Suspect improvement. A1c due in one month.  Discussed abdominal pain and onset of symptoms most likely related to Rybelsus.  Patient would like to stop Rybelsus completely which I think is reasonable to first determine if medication etiology for his pain and constipation.  If symptoms not completely resolved, I recommended a thorough further evaluation including CT abdomen and pelvis, colonoscopy.  He agrees with this plan.  Long discussion to abstain from soda since we are discontinuing Rybelsus.  He will continue metformin 1000 mg twice daily. He will call me with FBG > 180-200. Consider jardiance in the future.

## 2022-03-04 NOTE — Assessment & Plan Note (Signed)
Improved as patient rested in room. DBP soft and opted not to increased regimen as this time.  Continue telmisartan 40 mg, potassium chloride 10 mEq QOD, hydrochlorothiazide 12.5 mg, amlodipine 10 mg

## 2022-03-04 NOTE — Progress Notes (Signed)
Subjective:    Patient ID: Thomas Brandt, male    DOB: 1961-07-11, 61 y.o.   MRN: [id number]  CC: Thomas Brandt is a 61 y.o. male who presents today for follow up.   HPI: Feels well today.  No new complaints  DM-compliant with metformin 1000 mg twice per day, rybelsus 7mg . He reports right sided abdominal pain and change in bowel habits. He used to have BM twice per day and now every 2 days. He drinks pepsi daily. After bowl movement, abdominal pain improves. He suspect rybelus has caused based on when symptom started.  FBG 141, highest 150. No blurry vision, polydipsia, blood in stool.   Hypertension-compliant with telmisartan 40 mg, potassium chloride 10 mEq QOD, hydrochlorothiazide 12.5 mg, amlodipine 10 mg No urinary hesitancy , decreased urine stream.  HISTORY:  Past Medical History:  Diagnosis Date   Bell's palsy    61 years old   Hyperlipidemia    Hypertension    Stroke ([facility]) 2018   No past surgical history on file. Family History  Problem Relation Age of Onset   Heart disease Father    Thyroid cancer Neg Hx     Allergies: Patient has no known allergies. Current Outpatient Medications on File Prior to Visit  Medication Sig Dispense Refill   amLODipine (NORVASC) 10 MG tablet Take 1 tablet (10 mg total) by mouth daily. 90 tablet 3   aspirin 81 MG chewable tablet Chew by mouth daily.     cetirizine (ZYRTEC) 10 MG tablet TAKE 1 TABLET BY MOUTH EVERY DAY 90 tablet 3   Clotrimazole 1 % OINT Apply 1 application topically 2 (two) times daily. 56.7 g 1   hydrochlorothiazide (MICROZIDE) 12.5 MG capsule TAKE 1 CAPSULE BY MOUTH EVERY DAY 90 capsule 1   metFORMIN (GLUCOPHAGE) 1000 MG tablet TAKE 1 TABLET (1,000 MG TOTAL) BY MOUTH 2 (TWO) TIMES DAILY WITH A MEAL. 180 tablet 3   montelukast (SINGULAIR) 10 MG tablet TAKE 1 TABLET BY MOUTH EVERYDAY AT BEDTIME 90 tablet 1   potassium chloride (KLOR-CON) 10 MEQ tablet TAKE 1 TABLET (10 MEQ TOTAL) BY MOUTH EVERY OTHER DAY. 45  tablet 2   rosuvastatin (CRESTOR) 20 MG tablet TAKE 1 TABLET BY MOUTH EVERY DAY 90 tablet 1   telmisartan (MICARDIS) 40 MG tablet Take 1 tablet (40 mg total) by mouth daily. 90 tablet 3   Tiotropium Bromide Monohydrate (SPIRIVA RESPIMAT) 2.5 MCG/ACT AERS INHALE 2 PUFFS BY MOUTH INTO THE LUNGS DAILY 4 g 3   Current Facility-Administered Medications on File Prior to Visit  Medication Dose Route Frequency Provider Last Rate Last Admin   albuterol (PROVENTIL) (2.5 MG/3ML) 0.083% nebulizer solution 2.5 mg  2.5 mg Nebulization Once 2019, MD        Social History   Tobacco Use   Smoking status: Former    Packs/day: 0.75    Years: 39.00    Pack years: 29.25    Types: Cigarettes    Start date: 02/29/1980    Quit date: 04/30/2020    Years since quitting: 1.8   Smokeless tobacco: Never   Tobacco comments:    3-4 cigarettes a day currently  Vaping Use   Vaping Use: Some days    Review of Systems  Constitutional:  Negative for chills and fever.  Respiratory:  Negative for cough.   Cardiovascular:  Negative for chest pain and palpitations.  Gastrointestinal:  Positive for abdominal pain. Negative for nausea and vomiting.  Objective:    BP 135/65   Pulse 78   Temp 98.1 F (36.7 C) (Oral)   Ht 6' (1.829 m)   Wt 224 lb 12.8 oz (102 kg)   SpO2 97%   BMI 30.49 kg/m  BP Readings from Last 3 Encounters:  03/04/22 135/65  11/28/21 120/70  10/31/21 134/80   Wt Readings from Last 3 Encounters:  03/04/22 224 lb 12.8 oz (102 kg)  11/28/21 229 lb 6.4 oz (104.1 kg)  10/31/21 227 lb 6.4 oz (103.1 kg)    Physical Exam Vitals reviewed.  Constitutional:      Appearance: He is well-developed.  Cardiovascular:     Rate and Rhythm: Regular rhythm.     Heart sounds: Normal heart sounds.  Pulmonary:     Effort: Pulmonary effort is normal. No respiratory distress.     Breath sounds: Normal breath sounds. No wheezing, rhonchi or rales.  Skin:    General: Skin is warm and dry.   Neurological:     Mental Status: He is alert.  Psychiatric:        Speech: Speech normal.        Behavior: Behavior normal.       Assessment & Plan:   Problem List Items Addressed This Visit       Cardiovascular and Mediastinum   Essential hypertension    Improved as patient rested in room. DBP soft and opted not to increased regimen as this time.  Continue telmisartan 40 mg, potassium chloride 10 mEq QOD, hydrochlorothiazide 12.5 mg, amlodipine 10 mg         Endocrine   Type 2 diabetes mellitus without complication, without long-term current use of insulin ([facility]) - Primary    Suspect improvement. A1c due in one month.  Discussed abdominal pain and onset of symptoms most likely related to Rybelsus.  Patient would like to stop Rybelsus completely which I think is reasonable to first determine if medication etiology for his pain and constipation.  If symptoms not completely resolved, I recommended a thorough further evaluation including CT abdomen and pelvis, colonoscopy.  He agrees with this plan.  Long discussion to abstain from soda since we are discontinuing Rybelsus.  He will continue metformin 1000 mg twice daily. He will call me with FBG > 180-200. Consider jardiance in the future.        Relevant Orders   PSA   Comprehensive metabolic panel   Hemoglobin A1c   Note: He continues to declines CT lung cancer screen  I have discontinued Oshae Simmering. Giacomo's Rybelsus. I am also having him maintain his aspirin, montelukast, cetirizine, Clotrimazole, potassium chloride, rosuvastatin, hydrochlorothiazide, amLODipine, telmisartan, Spiriva Respimat, and metFORMIN.   No orders of the defined types were placed in this encounter.   Return precautions given.   Risks, benefits, and alternatives of the medications and treatment plan prescribed today were discussed, and patient expressed understanding.   Education regarding symptom management and diagnosis given to patient on  AVS.  Continue to follow with Allegra Grana, FNP for routine health maintenance.   Lucky Rathke and I agreed with plan.   Rennie Plowman, FNP

## 2022-03-04 NOTE — Patient Instructions (Signed)
Please abstain from soda since we are discontinuing Rybelsus.  ontinue metformin 1000 mg twice daily.    call me with any blood glucose  > 180-200 as we may need to start another diabetic medication.

## 2022-04-03 ENCOUNTER — Other Ambulatory Visit (INDEPENDENT_AMBULATORY_CARE_PROVIDER_SITE_OTHER): Payer: 59

## 2022-04-03 DIAGNOSIS — E119 Type 2 diabetes mellitus without complications: Secondary | ICD-10-CM

## 2022-04-03 LAB — COMPREHENSIVE METABOLIC PANEL
ALT: 28 U/L (ref 0–53)
AST: 19 U/L (ref 0–37)
Albumin: 4.3 g/dL (ref 3.5–5.2)
Alkaline Phosphatase: 80 U/L (ref 39–117)
BUN: 16 mg/dL (ref 6–23)
CO2: 28 mEq/L (ref 19–32)
Calcium: 9.3 mg/dL (ref 8.4–10.5)
Chloride: 99 mEq/L (ref 96–112)
Creatinine, Ser: 0.9 mg/dL (ref 0.40–1.50)
GFR: 92.79 mL/min (ref >60.00)
Glucose, Bld: 216 mg/dL — ABNORMAL HIGH (ref 70–99)
Potassium: 4.3 mEq/L (ref 3.5–5.1)
Sodium: 139 mEq/L (ref 135–145)
Total Bilirubin: 0.8 mg/dL (ref 0.2–1.2)
Total Protein: 6.9 g/dL (ref 6.0–8.3)

## 2022-04-03 LAB — PSA: PSA: 0.46 ng/mL (ref 0.10–4.00)

## 2022-04-03 LAB — HEMOGLOBIN A1C: Hgb A1c MFr Bld: 7.6 % — ABNORMAL HIGH (ref 4.6–6.5)

## 2022-04-19 ENCOUNTER — Other Ambulatory Visit: Payer: Self-pay

## 2022-04-19 ENCOUNTER — Encounter: Payer: Self-pay | Admitting: Family

## 2022-04-19 ENCOUNTER — Other Ambulatory Visit: Payer: Self-pay | Admitting: Family

## 2022-04-19 ENCOUNTER — Ambulatory Visit: Payer: 59 | Admitting: Family

## 2022-04-19 DIAGNOSIS — I10 Essential (primary) hypertension: Secondary | ICD-10-CM

## 2022-04-19 DIAGNOSIS — J449 Chronic obstructive pulmonary disease, unspecified: Secondary | ICD-10-CM

## 2022-04-19 DIAGNOSIS — E119 Type 2 diabetes mellitus without complications: Secondary | ICD-10-CM

## 2022-04-19 DIAGNOSIS — E782 Mixed hyperlipidemia: Secondary | ICD-10-CM

## 2022-04-19 MED ORDER — SPIRIVA RESPIMAT 2.5 MCG/ACT IN AERS: INHALATION_SPRAY | RESPIRATORY_TRACT | 3 refills | Status: DC

## 2022-04-19 NOTE — Assessment & Plan Note (Addendum)
Lab Results  Component Value Date   HGBA1C 7.6 (H) 04/03/2022   No further recurrence abdominal pain.  He is no longer on Rybelsus.  Continue metformin 1000 mg twice daily. Counseled on avoidance of soda. We opted not to make medication change today and to continue to work on dietary changes.

## 2022-04-19 NOTE — Progress Notes (Signed)
Subjective:    Patient ID: Thomas Brandt, male    DOB: Jan 23, 1961, 61 y.o.   MRN: [id number]  CC: Thomas Brandt is a 61 y.o. male who presents today for follow up.   HPI: Feels well today.  No new complaints   DM- abdominal pain has completely resolved.   He is no longer on rybelus. He is compliant with metformin 1000mg  bid.  Rarely checks his blood sugar. Drinking less soda.   HTN- compliant with telmisartan 40 mg, potassium chloride 10 mEq QOD, hydrochlorothiazide 12.5 mg, amlodipine 10 mg HISTORY:  Past Medical History:  Diagnosis Date   Bell's palsy    61 years old   Hyperlipidemia    Hypertension    Stroke (HCC) 2018   No past surgical history on file. Family History  Problem Relation Age of Onset   Heart disease Father    Thyroid cancer Neg Hx     Allergies: Patient has no known allergies. Current Outpatient Medications on File Prior to Visit  Medication Sig Dispense Refill   amLODipine (NORVASC) 10 MG tablet Take 1 tablet (10 mg total) by mouth daily. 90 tablet 3   aspirin 81 MG chewable tablet Chew by mouth daily.     cetirizine (ZYRTEC) 10 MG tablet TAKE 1 TABLET BY MOUTH EVERY DAY 90 tablet 3   Clotrimazole 1 % OINT Apply 1 application topically 2 (two) times daily. 56.7 g 1   hydrochlorothiazide (MICROZIDE) 12.5 MG capsule TAKE 1 CAPSULE BY MOUTH EVERY DAY 90 capsule 1   metFORMIN (GLUCOPHAGE) 1000 MG tablet TAKE 1 TABLET (1,000 MG TOTAL) BY MOUTH 2 (TWO) TIMES DAILY WITH A MEAL. 180 tablet 3   montelukast (SINGULAIR) 10 MG tablet TAKE 1 TABLET BY MOUTH EVERYDAY AT BEDTIME 90 tablet 1   telmisartan (MICARDIS) 40 MG tablet Take 1 tablet (40 mg total) by mouth daily. 90 tablet 3   Current Facility-Administered Medications on File Prior to Visit  Medication Dose Route Frequency Provider Last Rate Last Admin   albuterol (PROVENTIL) (2.5 MG/3ML) 0.083% nebulizer solution 2.5 mg  2.5 mg Nebulization Once 2019, MD        Social History    Tobacco Use   Smoking status: Former    Packs/day: 0.75    Years: 39.00    Total pack years: 29.25    Types: Cigarettes    Start date: 02/29/1980    Quit date: 04/30/2020    Years since quitting: 1.9   Smokeless tobacco: Never   Tobacco comments:    3-4 cigarettes a day currently  Vaping Use   Vaping Use: Some days    Review of Systems  Constitutional:  Negative for chills and fever.  Respiratory:  Negative for cough.   Cardiovascular:  Negative for chest pain and palpitations.  Gastrointestinal:  Negative for abdominal pain, nausea and vomiting.      Objective:    BP 130/70 (BP Location: Left Arm, Patient Position: Sitting, Cuff Size: Normal)   Pulse (!) 108   Temp 97.8 F (36.6 C) (Oral)   Ht 6' (1.829 m)   Wt 227 lb 9.6 oz (103.2 kg)   SpO2 97%   BMI 30.87 kg/m  BP Readings from Last 3 Encounters:  04/19/22 130/70  03/04/22 135/65  11/28/21 120/70   Wt Readings from Last 3 Encounters:  04/19/22 227 lb 9.6 oz (103.2 kg)  03/04/22 224 lb 12.8 oz (102 kg)  11/28/21 229 lb 6.4 oz (104.1 kg)  Physical Exam Vitals reviewed.  Constitutional:      Appearance: He is well-developed.  Cardiovascular:     Rate and Rhythm: Regular rhythm.     Heart sounds: Normal heart sounds.  Pulmonary:     Effort: Pulmonary effort is normal. No respiratory distress.     Breath sounds: Normal breath sounds. No wheezing, rhonchi or rales.  Skin:    General: Skin is warm and dry.  Neurological:     Mental Status: He is alert.  Psychiatric:        Speech: Speech normal.        Behavior: Behavior normal.        Assessment & Plan:   Problem List Items Addressed This Visit       Cardiovascular and Mediastinum   Essential hypertension    Chronic, stable.  Continue telmisartan 40 mg, potassium chloride 10 mEq QOD, hydrochlorothiazide 12.5 mg, amlodipine 10 mg        Endocrine   Type 2 diabetes mellitus without complication, without long-term current use of insulin (HCC)     Lab Results  Component Value Date   HGBA1C 7.6 (H) 04/03/2022  No further recurrence abdominal pain.  He is no longer on Rybelsus.  Continue metformin 1000 mg twice daily. Counseled on avoidance of soda. We opted not to make medication change today and to continue to work on dietary changes.         I am having Thomas Brandt maintain his aspirin, montelukast, cetirizine, Clotrimazole, hydrochlorothiazide, amLODipine, telmisartan, metFORMIN, rosuvastatin, potassium chloride, and Spiriva Respimat.   No orders of the defined types were placed in this encounter.   Return precautions given.   Risks, benefits, and alternatives of the medications and treatment plan prescribed today were discussed, and patient expressed understanding.   Education regarding symptom management and diagnosis given to patient on AVS.  Continue to follow with Allegra Grana, FNP for routine health maintenance.   Thomas Brandt and I agreed with plan.   Thomas Plowman, FNP

## 2022-04-19 NOTE — Assessment & Plan Note (Signed)
Chronic, stable.  Continue telmisartan 40 mg, potassium chloride 10 mEq QOD, hydrochlorothiazide 12.5 mg, amlodipine 10 mg

## 2022-06-24 ENCOUNTER — Telehealth: Payer: Self-pay | Admitting: Family

## 2022-06-26 ENCOUNTER — Other Ambulatory Visit: Payer: Self-pay

## 2022-06-26 MED ORDER — HYDROCHLOROTHIAZIDE 12.5 MG PO CAPS: 12.5000 mg | ORAL_CAPSULE | Freq: Every day | ORAL | 3 refills | Status: DC

## 2022-06-26 NOTE — Telephone Encounter (Signed)
Rx sent and patient is aware. 

## 2022-06-26 NOTE — Telephone Encounter (Signed)
Patient has an appointment on 10/6 .Needing refill on hydrochlorothiazide (MICROZIDE) 12.5 MG capsule

## 2022-06-29 ENCOUNTER — Other Ambulatory Visit: Payer: Self-pay | Admitting: Family

## 2022-06-29 DIAGNOSIS — J449 Chronic obstructive pulmonary disease, unspecified: Secondary | ICD-10-CM

## 2022-07-05 ENCOUNTER — Ambulatory Visit: Payer: 59 | Admitting: Family

## 2022-07-12 ENCOUNTER — Ambulatory Visit: Payer: 59 | Admitting: Family

## 2022-07-22 ENCOUNTER — Ambulatory Visit: Payer: 59 | Admitting: Family

## 2022-07-24 ENCOUNTER — Encounter: Payer: Self-pay | Admitting: Family

## 2022-07-24 ENCOUNTER — Ambulatory Visit: Payer: 59 | Admitting: Family

## 2022-07-24 VITALS — BP 130/78 | HR 94 | Temp 98.0°F | Ht 72.0 in | Wt 223.6 lb

## 2022-07-24 DIAGNOSIS — G629 Polyneuropathy, unspecified: Secondary | ICD-10-CM | POA: Insufficient documentation

## 2022-07-24 DIAGNOSIS — I10 Essential (primary) hypertension: Secondary | ICD-10-CM

## 2022-07-24 DIAGNOSIS — E538 Deficiency of other specified B group vitamins: Secondary | ICD-10-CM | POA: Diagnosis not present

## 2022-07-24 DIAGNOSIS — K130 Diseases of lips: Secondary | ICD-10-CM

## 2022-07-24 DIAGNOSIS — E119 Type 2 diabetes mellitus without complications: Secondary | ICD-10-CM

## 2022-07-24 DIAGNOSIS — G6289 Other specified polyneuropathies: Secondary | ICD-10-CM | POA: Diagnosis not present

## 2022-07-24 LAB — POCT GLYCOSYLATED HEMOGLOBIN (HGB A1C): Hemoglobin A1C: 9.7 % — AB (ref 4.0–5.6)

## 2022-07-24 MED ORDER — RYBELSUS 3 MG PO TABS: 3.0000 mg | ORAL_TABLET | Freq: Every day | ORAL | 1 refills | Status: DC

## 2022-07-24 MED ORDER — NYSTATIN-TRIAMCINOLONE 100000-0.1 UNIT/GM-% EX OINT: 1.0000 | TOPICAL_OINTMENT | Freq: Two times a day (BID) | CUTANEOUS | 3 refills | Status: DC | PRN

## 2022-07-24 MED ORDER — GABAPENTIN 100 MG PO CAPS: 100.0000 mg | ORAL_CAPSULE | Freq: Every day | ORAL | 3 refills | Status: DC

## 2022-07-24 NOTE — Assessment & Plan Note (Signed)
Improved as patient rested in exam room.  Continue amlodipine 10 mg, hydrochlorothiazide 12.5 mg, potassium chloride 10 meq , telmisartan 40mg .

## 2022-07-24 NOTE — Assessment & Plan Note (Signed)
Lab Results  Component Value Date   HGBA1C 9.7 (A) 07/24/2022   Uncontrolled.  Patient agreeable to retrial of Rybelsus 3 mg.  He will remain vigilant and let me know if flank abdominal pain were to recur.  If recurred, would consider alternative options including glipizide or Jardiance.  Continue metformin 1000 mg

## 2022-07-24 NOTE — Patient Instructions (Addendum)
For numbness in right foot, we will start trial of gabapentin 100 mg at bedtime.  This medication is sedating.    Restart rybelsus 3mg  once daily Let me know if side , abdominal or flank pain recurs  Start rybelsus 3mg  for 30 days and then we will increase to 7mg  once daily. You may call the office for this new prescription.  Also, with the Rybelsus, be very sure that you take on an empty stomach with a little bit of water and take 30 minutes before eating. Do not take with other medicines and no other food with medication. This is important for effectiveness.   Remember black box warning that you may not take this medication if you or a family member is diagnosed with thyroid cancer.    Semaglutide Oral Tablets What is this medicine? SEMAGLUTIDE (Sem a GLOO tide) controls blood sugar in people with type 2 diabetes. It is used with lifestyle changes like diet and exercise. This medicine may be used for other purposes; ask your health care provider or pharmacist if you have questions. COMMON BRAND NAME(S): Rybelsus What should I tell my health care provider before I take this medicine? They need to know if you have any of these conditions: endocrine tumors (MEN 2) or if someone in your family had these tumors eye disease history of pancreatitis kidney disease stomach or intestine problems thyroid cancer or if someone in your family had thyroid cancer vision problems an unusual or allergic reaction to semaglutide, other medicines, foods, dyes, or preservatives pregnant or trying to get pregnant breast-feeding How should I use this medicine? Take this medicine by mouth. Take it as directed on the prescription label at the same time every day. Take the dose right after waking up. Do not eat or drink anything before taking it. Do not take it with any other drink except a glass of plain water that is less than 4 ounces (less than 120 mL). Do not cut, crush or chew this medicine. Swallow the  tablets whole. After taking it, do not eat breakfast, drink, or take any other medicines or vitamins for at least 30 minutes. Keep taking it unless your health care provider tells you to stop. A special MedGuide will be given to you by the pharmacist with each prescription and refill. Be sure to read this information carefully each time. Talk to your health care provider about the use of this medicine in children. Special care may be needed. Overdosage: If you think you have taken too much of this medicine contact a poison control center or emergency room at once. NOTE: This medicine is only for you. Do not share this medicine with others. What if I miss a dose? If you miss a dose, skip it. Take your next dose at the normal time. Do not take extra or 2 doses at the same time to make up for the missed dose. What may interact with this medicine? What may interact with this medicine? aminophylline carbamazepine cyclosporine digoxin levothyroxine other medicines for diabetes phenytoin tacrolimus theophylline warfarin Many medications may cause changes in blood sugar, these include: alcohol containing beverages antiviral medicines for HIV or AIDS aspirin and aspirin-like drugs certain medicines for blood pressure, heart disease, irregular heart beat chromium diuretics male hormones, such as estrogens or progestins, birth control pills fenofibrate gemfibrozil isoniazid lanreotide male hormones or anabolic steroids MAOIs like Carbex, Eldepryl, Marplan, Nardil, and Parnate medicines for weight loss medicines for allergies, asthma, cold, or cough medicines for depression,  anxiety, or psychotic disturbances niacin nicotine NSAIDs, medicines for pain and inflammation, like ibuprofen or naproxen octreotide pasireotide pentamidine phenytoin probenecid quinolone antibiotics such as ciprofloxacin, levofloxacin, ofloxacin some herbal dietary supplements steroid medicines such as  prednisone or cortisone sulfamethoxazole; trimethoprim thyroid hormones Some medications can hide the warning symptoms of low blood sugar (hypoglycemia). You may need to monitor your blood sugar more closely if you are taking one of these medications. These include: beta-blockers, often used for high blood pressure or heart problems (examples include atenolol, metoprolol, propranolol) clonidine guanethidine reserpine This list may not describe all possible interactions. Give your health care provider a list of all the medicines, herbs, non-prescription drugs, or dietary supplements you use. Also tell them if you smoke, drink alcohol, or use illegal drugs. Some items may interact with your medicine. What should I watch for while using this medicine? Visit your health care provider for regular checks on your progress. Check with your health care provider if you have severe diarrhea, nausea, and vomiting, or if you sweat a lot. The loss of too much body fluid may make it dangerous for you to take this medicine. A test called the HbA1C (A1C) will be monitored. This is a simple blood test. It measures your blood sugar control over the last 2 to 3 months. You will receive this test every 3 to 6 months. Learn how to check your blood sugar. Learn the symptoms of low and high blood sugar and how to manage them. Always carry a quick-source of sugar with you in case you have symptoms of low blood sugar. Examples include hard sugar candy or glucose tablets. Make sure others know that you can choke if you eat or drink when you develop serious symptoms of low blood sugar, such as seizures or unconsciousness. Get medical help at once. Tell your health care provider if you have high blood sugar. You might need to change the dose of your medicine. If you are sick or exercising more than usual, you might need to change the dose of your medicine. Do not skip meals. Ask your health care provider if you should avoid  alcohol. Many nonprescription cough and cold products contain sugar or alcohol. These can affect blood sugar. Wear a medical ID bracelet or chain. Carry a card that describes your condition. List the medicines and doses you take on the card. Do not become pregnant while taking this medicine. Women should inform their health care provider if they wish to become pregnant or think they might be pregnant. There is a potential for serious side effects to an unborn child. Talk to your health care provider for more information. Do not breast-feed an infant while taking this medicine. What side effects may I notice from receiving this medicine? Side effects that you should report to your doctor or health care provider as soon as possible: allergic reactions (skin rash, itching or hives; swelling of the face, lips, or tongue) changes in vision diarrhea that continues or is severe infection (fever, chills, cough, sore throat, pain or trouble passing urine) kidney injury (trouble passing urine or change in the amount of urine) low blood sugar (feeling anxious; confusion; dizziness; increased hunger; unusually weak or tired; increased sweating; shakiness; cold, clammy skin; irritable; headache; blurred vision; fast heartbeat; loss of consciousness) lump or swelling on the neck painful or difficulty swallowing severe nausea severe or unusual stomach pain trouble breathing vomiting Side effects that usually do not require medical attention (report these to your doctor or  health care provider if they continue or are bothersome): constipation diarrhea nausea upset stomach This list may not describe all possible side effects. Call your doctor for medical advice about side effects. You may report side effects to FDA at 1-800-FDA-1088. Where should I keep my medicine? Keep out of the reach of children and pets. Store at room temperature between 20 and 25 degrees C (68 and 77 degrees F). Keep this medicine in  the original container. Protect from moisture. Keep the container tightly closed. Get rid of any unused medicine after the expiration date. To get rid of medicines that are no longer needed or have expired: Take the medicine to a medicine take-back program. Check with your pharmacy or law enforcement to find a location. If you cannot return the medicine, check the label or package insert to see if the medicine should be thrown out in the garbage or flushed down the toilet. If you are not sure, ask your health care provider. If it is safe to put it in the trash, take the medicine out of the container. Mix the medicine with cat litter, dirt, coffee grounds, or other unwanted substance. Seal the mixture in a bag or container. Put it in the trash. NOTE: This sheet is a summary. It may not cover all possible information. If you have questions about this medicine, talk to your doctor, pharmacist, or health care provider.  2021 Elsevier/Gold Standard (2020-08-14 15:08:33)

## 2022-07-24 NOTE — Assessment & Plan Note (Signed)
Chronic, stable .I provided refill of nystatin triamcinolone which has been helpful for him.

## 2022-07-24 NOTE — Assessment & Plan Note (Signed)
Suspect neuropathy is related to A1c and increase thereof.  Patient agreeable to starting gabapentin 100 mg nightly.  Pending B12 studies

## 2022-07-24 NOTE — Progress Notes (Signed)
Subjective:    Patient ID: Thomas Brandt, male    DOB: Apr 08, 1961, 61 y.o.   MRN: [id number]  CC: [name] is a 61 y.o. male who presents today for follow up.   HPI:  DM-compliant metformin 1000 mg twice daily. Endorses dietary in discretion recently with move. Endorses numbness in right toes. No leg numbness, low back pain.    History of B12 deficiency.  He is no longer on b12   He remains compliant with 81 mg aspirin.  History of ischemic stroke.  Hypertension-compliant with amlodipine 10 mg, hydrochlorothiazide 12.5 mg, potassium chloride 10 meq , telmisartan 40mg .  He feels regimen is working well  He requests refill of nystatin triamcinolone which has been very helpful in regards to rash in creases of his lips.  He uses this as needed  HISTORY:  Past Medical History:  Diagnosis Date   Bell's palsy    60 years old   Hyperlipidemia    Hypertension    Stroke (HCC) 2018   No past surgical history on file. Family History  Problem Relation Age of Onset   Heart disease Father    Thyroid cancer Neg Hx     Allergies: Patient has no known allergies. Current Outpatient Medications on File Prior to Visit  Medication Sig Dispense Refill   amLODipine (NORVASC) 10 MG tablet Take 1 tablet (10 mg total) by mouth daily. 90 tablet 3   aspirin 81 MG chewable tablet Chew by mouth daily.     cetirizine (ZYRTEC) 10 MG tablet TAKE 1 TABLET BY MOUTH EVERY DAY 90 tablet 3   Clotrimazole 1 % OINT Apply 1 application topically 2 (two) times daily. 56.7 g 1   hydrochlorothiazide (MICROZIDE) 12.5 MG capsule Take 1 capsule (12.5 mg total) by mouth daily. 90 capsule 3   metFORMIN (GLUCOPHAGE) 1000 MG tablet TAKE 1 TABLET (1,000 MG TOTAL) BY MOUTH 2 (TWO) TIMES DAILY WITH A MEAL. 180 tablet 3   montelukast (SINGULAIR) 10 MG tablet TAKE 1 TABLET BY MOUTH EVERYDAY AT BEDTIME 90 tablet 1   potassium chloride (KLOR-CON) 10 MEQ tablet TAKE 1 TABLET (10 MEQ TOTAL) BY MOUTH EVERY OTHER DAY. 45  tablet 2   rosuvastatin (CRESTOR) 20 MG tablet TAKE 1 TABLET BY MOUTH EVERY DAY 90 tablet 1   telmisartan (MICARDIS) 40 MG tablet Take 1 tablet (40 mg total) by mouth daily. 90 tablet 3   Tiotropium Bromide Monohydrate (SPIRIVA RESPIMAT) 2.5 MCG/ACT AERS INHALE 2 PUFFS BY MOUTH INTO THE LUNGS DAILY 4 g 3   Current Facility-Administered Medications on File Prior to Visit  Medication Dose Route Frequency Provider Last Rate Last Admin   albuterol (PROVENTIL) (2.5 MG/3ML) 0.083% nebulizer solution 2.5 mg  2.5 mg Nebulization Once 2019, MD        Social History   Tobacco Use   Smoking status: Former    Packs/day: 0.75    Years: 39.00    Total pack years: 29.25    Types: Cigarettes    Start date: 02/29/1980    Quit date: 04/30/2020    Years since quitting: 2.2   Smokeless tobacco: Never   Tobacco comments:    3-4 cigarettes a day currently  Vaping Use   Vaping Use: Some days    Review of Systems  Constitutional:  Negative for chills and fever.  Respiratory:  Negative for cough.   Cardiovascular:  Negative for chest pain and palpitations.  Gastrointestinal:  Negative for nausea and vomiting.  Neurological:  Positive for numbness.      Objective:    BP 130/78   Pulse 94   Temp 98 F (36.7 C) (Oral)   Ht 6' (1.829 m)   Wt 223 lb 9.6 oz (101.4 kg)   SpO2 95%   BMI 30.33 kg/m  BP Readings from Last 3 Encounters:  07/24/22 130/78  04/19/22 130/70  03/04/22 135/65   Wt Readings from Last 3 Encounters:  07/24/22 223 lb 9.6 oz (101.4 kg)  04/19/22 227 lb 9.6 oz (103.2 kg)  03/04/22 224 lb 12.8 oz (102 kg)    Physical Exam Vitals reviewed.  Constitutional:      Appearance: He is well-developed.  Cardiovascular:     Rate and Rhythm: Regular rhythm.     Heart sounds: Normal heart sounds.  Pulmonary:     Effort: Pulmonary effort is normal. No respiratory distress.     Breath sounds: Normal breath sounds. No wheezing, rhonchi or rales.  Skin:    General:  Skin is warm and dry.  Neurological:     Mental Status: He is alert.  Psychiatric:        Speech: Speech normal.        Behavior: Behavior normal.        Assessment & Plan:   Problem List Items Addressed This Visit       Cardiovascular and Mediastinum   Essential hypertension    Improved as patient rested in exam room.  Continue amlodipine 10 mg, hydrochlorothiazide 12.5 mg, potassium chloride 10 meq , telmisartan 40mg .      Relevant Orders   Comprehensive metabolic panel   TSH     Digestive   Cheilitis    Chronic, stable .I provided refill of nystatin triamcinolone which has been helpful for him.      Relevant Medications   nystatin-triamcinolone ointment (MYCOLOG)     Endocrine   Type 2 diabetes mellitus without complication, without long-term current use of insulin ([place]) - Primary    Lab Results  Component Value Date   HGBA1C 9.7 (A) 07/24/2022  Uncontrolled.  Patient agreeable to retrial of Rybelsus 3 mg.  He will remain vigilant and let me know if flank abdominal pain were to recur.  If recurred, would consider alternative options including glipizide or Jardiance.  Continue metformin 1000 mg      Relevant Medications   Semaglutide (RYBELSUS) 3 MG TABS   Other Relevant Orders   POCT HgB A1C (Completed)   Lipid panel   Microalbumin / creatinine urine ratio   Comprehensive metabolic panel   CBC with Differential/Platelet     Nervous and Auditory   Peripheral neuropathy    Suspect neuropathy is related to A1c and increase thereof.  Patient agreeable to starting gabapentin 100 mg nightly.  Pending B12 studies       Relevant Medications   gabapentin (NEURONTIN) 100 MG capsule   Other Relevant Orders   B12 and Folate Panel   Celiac Disease Ab Screen w/Rfx   Homocysteine   Intrinsic Factor Antibodies   Methylmalonic acid, serum   TSH   Other Visit Diagnoses     B12 deficiency       Relevant Orders   B12 and Folate Panel   Celiac Disease Ab Screen  w/Rfx   Homocysteine   Intrinsic Factor Antibodies   Methylmalonic acid, serum        I am having Christen Butter start on Rybelsus, nystatin-triamcinolone ointment, and gabapentin. I am also having  him maintain his aspirin, montelukast, cetirizine, Clotrimazole, amLODipine, telmisartan, metFORMIN, rosuvastatin, potassium chloride, Spiriva Respimat, and hydrochlorothiazide.   Meds ordered this encounter  Medications   Semaglutide (RYBELSUS) 3 MG TABS    Sig: Take 3 mg by mouth daily.    Dispense:  30 tablet    Refill:  1    Order Specific Question:   Supervising Provider    Answer:   Sherlene Shams [[id number]]   nystatin-triamcinolone ointment (MYCOLOG)    Sig: Apply 1 Application topically 2 (two) times daily as needed.    Dispense:  30 g    Refill:  3    Order Specific Question:   Supervising Provider    Answer:   Duncan Dull L [[id number]]   gabapentin (NEURONTIN) 100 MG capsule    Sig: Take 1 capsule (100 mg total) by mouth at bedtime.    Dispense:  90 capsule    Refill:  3    Order Specific Question:   Supervising Provider    Answer:   Sherlene Shams [[id number]]    Return precautions given.   Risks, benefits, and alternatives of the medications and treatment plan prescribed today were discussed, and patient expressed understanding.   Education regarding symptom management and diagnosis given to patient on AVS.  Continue to follow with Allegra Grana, FNP for routine health maintenance.   Lucky Rathke and I agreed with plan.   Rennie Plowman, FNP

## 2022-08-21 ENCOUNTER — Other Ambulatory Visit: Payer: 59

## 2022-08-25 ENCOUNTER — Other Ambulatory Visit: Payer: Self-pay | Admitting: Family

## 2022-08-25 DIAGNOSIS — E782 Mixed hyperlipidemia: Secondary | ICD-10-CM

## 2022-08-28 ENCOUNTER — Other Ambulatory Visit (INDEPENDENT_AMBULATORY_CARE_PROVIDER_SITE_OTHER): Payer: 59

## 2022-08-28 DIAGNOSIS — G6289 Other specified polyneuropathies: Secondary | ICD-10-CM

## 2022-08-28 DIAGNOSIS — E538 Deficiency of other specified B group vitamins: Secondary | ICD-10-CM | POA: Diagnosis not present

## 2022-08-28 DIAGNOSIS — E119 Type 2 diabetes mellitus without complications: Secondary | ICD-10-CM

## 2022-08-28 DIAGNOSIS — I10 Essential (primary) hypertension: Secondary | ICD-10-CM

## 2022-08-28 LAB — COMPREHENSIVE METABOLIC PANEL
ALT: 31 U/L (ref 0–53)
AST: 31 U/L (ref 0–37)
Albumin: 4.4 g/dL (ref 3.5–5.2)
Alkaline Phosphatase: 72 U/L (ref 39–117)
BUN: 22 mg/dL (ref 6–23)
CO2: 27 mEq/L (ref 19–32)
Calcium: 9 mg/dL (ref 8.4–10.5)
Chloride: 93 mEq/L — ABNORMAL LOW (ref 96–112)
Creatinine, Ser: 0.68 mg/dL (ref 0.40–1.50)
GFR: 100.7 mL/min (ref >60.00)
Glucose, Bld: 277 mg/dL — ABNORMAL HIGH (ref 70–99)
Potassium: 4.6 mEq/L (ref 3.5–5.1)
Sodium: 131 mEq/L — ABNORMAL LOW (ref 135–145)
Total Bilirubin: 1.3 mg/dL — ABNORMAL HIGH (ref 0.2–1.2)
Total Protein: 7.9 g/dL (ref 6.0–8.3)

## 2022-08-28 LAB — CBC WITH DIFFERENTIAL/PLATELET
Basophils Absolute: 0.1 10*3/uL (ref 0.0–0.1)
Basophils Relative: 0.7 % (ref 0.0–3.0)
Eosinophils Absolute: 1.5 10*3/uL — ABNORMAL HIGH (ref 0.0–0.7)
Eosinophils Relative: 14.6 % — ABNORMAL HIGH (ref 0.0–5.0)
HCT: 42.2 % (ref 39.0–52.0)
Hemoglobin: 14.5 g/dL (ref 13.0–17.0)
Lymphocytes Relative: 30.3 % (ref 12.0–46.0)
Lymphs Abs: 3.2 10*3/uL (ref 0.7–4.0)
MCHC: 34.5 g/dL (ref 30.0–36.0)
MCV: 84.1 fl (ref 78.0–100.0)
Monocytes Absolute: 0.8 10*3/uL (ref 0.1–1.0)
Monocytes Relative: 7.5 % (ref 3.0–12.0)
Neutro Abs: 4.9 10*3/uL (ref 1.4–7.7)
Neutrophils Relative %: 46.9 % (ref 43.0–77.0)
Platelets: 342 10*3/uL (ref 150.0–400.0)
RBC: 5.01 Mil/uL (ref 4.22–5.81)
RDW: 14 % (ref 11.5–15.5)
WBC: 10.5 10*3/uL (ref 4.0–10.5)

## 2022-08-28 LAB — MICROALBUMIN / CREATININE URINE RATIO
Creatinine,U: 90.9 mg/dL
Microalb Creat Ratio: 0.8 mg/g (ref 0.0–30.0)
Microalb, Ur: <0.7 mg/dL (ref 0.0–1.9)

## 2022-08-28 LAB — TSH: TSH: 3.82 u[IU]/mL (ref 0.35–5.50)

## 2022-08-28 LAB — LIPID PANEL
Cholesterol: 107 mg/dL (ref 0–200)
HDL: 38.2 mg/dL — ABNORMAL LOW (ref >39.00)
NonHDL: 68.49
Total CHOL/HDL Ratio: 3
Triglycerides: 230 mg/dL — ABNORMAL HIGH (ref 0.0–149.0)
VLDL: 46 mg/dL — ABNORMAL HIGH (ref 0.0–40.0)

## 2022-08-28 LAB — B12 AND FOLATE PANEL
Folate: 10.5 ng/mL (ref >5.9)
Vitamin B-12: 195 pg/mL — ABNORMAL LOW (ref 211–911)

## 2022-08-28 LAB — LDL CHOLESTEROL, DIRECT: Direct LDL: 43 mg/dL

## 2022-08-30 LAB — CELIAC DISEASE AB SCREEN W/RFX
Antigliadin Abs, IgA: 12 units (ref 0–19)
IgA/Immunoglobulin A, Serum: 505 mg/dL — ABNORMAL HIGH (ref 90–386)
Transglutaminase IgA: <2 U/mL (ref 0–3)

## 2022-08-30 LAB — INTRINSIC FACTOR ANTIBODIES: Intrinsic Factor: NEGATIVE

## 2022-08-30 LAB — HOMOCYSTEINE: Homocysteine: 14.1 umol/L — ABNORMAL HIGH (ref <11.4)

## 2022-08-30 LAB — METHYLMALONIC ACID, SERUM: Methylmalonic Acid, Quant: 259 nmol/L (ref 87–318)

## 2022-09-17 ENCOUNTER — Other Ambulatory Visit: Payer: Self-pay | Admitting: Family

## 2022-09-17 ENCOUNTER — Telehealth: Payer: Self-pay

## 2022-09-17 DIAGNOSIS — R899 Unspecified abnormal finding in specimens from other organs, systems and tissues: Secondary | ICD-10-CM

## 2022-09-17 DIAGNOSIS — E538 Deficiency of other specified B group vitamins: Secondary | ICD-10-CM | POA: Insufficient documentation

## 2022-09-17 NOTE — Telephone Encounter (Signed)
LVM to call back to go over results 

## 2022-10-03 ENCOUNTER — Ambulatory Visit (INDEPENDENT_AMBULATORY_CARE_PROVIDER_SITE_OTHER): Payer: 59

## 2022-10-03 DIAGNOSIS — R899 Unspecified abnormal finding in specimens from other organs, systems and tissues: Secondary | ICD-10-CM | POA: Diagnosis not present

## 2022-10-03 DIAGNOSIS — E538 Deficiency of other specified B group vitamins: Secondary | ICD-10-CM | POA: Diagnosis not present

## 2022-10-03 LAB — CBC WITH DIFFERENTIAL/PLATELET
Basophils Absolute: 0.1 10*3/uL (ref 0.0–0.1)
Basophils Relative: 0.7 % (ref 0.0–3.0)
Eosinophils Absolute: 1.2 10*3/uL — ABNORMAL HIGH (ref 0.0–0.7)
Eosinophils Relative: 11.7 % — ABNORMAL HIGH (ref 0.0–5.0)
HCT: 40.9 % (ref 39.0–52.0)
Hemoglobin: 13.6 g/dL (ref 13.0–17.0)
Lymphocytes Relative: 31.9 % (ref 12.0–46.0)
Lymphs Abs: 3.2 10*3/uL (ref 0.7–4.0)
MCHC: 33.2 g/dL (ref 30.0–36.0)
MCV: 84.9 fl (ref 78.0–100.0)
Monocytes Absolute: 0.8 10*3/uL (ref 0.1–1.0)
Monocytes Relative: 8.1 % (ref 3.0–12.0)
Neutro Abs: 4.7 10*3/uL (ref 1.4–7.7)
Neutrophils Relative %: 47.6 % (ref 43.0–77.0)
Platelets: 336 10*3/uL (ref 150.0–400.0)
RBC: 4.81 Mil/uL (ref 4.22–5.81)
RDW: 14.6 % (ref 11.5–15.5)
WBC: 9.9 10*3/uL (ref 4.0–10.5)

## 2022-10-03 LAB — BASIC METABOLIC PANEL
BUN: 13 mg/dL (ref 6–23)
CO2: 30 mEq/L (ref 19–32)
Calcium: 9 mg/dL (ref 8.4–10.5)
Chloride: 97 mEq/L (ref 96–112)
Creatinine, Ser: 0.69 mg/dL (ref 0.40–1.50)
GFR: 100.19 mL/min (ref >60.00)
Glucose, Bld: 203 mg/dL — ABNORMAL HIGH (ref 70–99)
Potassium: 4.2 mEq/L (ref 3.5–5.1)
Sodium: 136 mEq/L (ref 135–145)

## 2022-10-03 LAB — HEPATIC FUNCTION PANEL
ALT: 26 U/L (ref 0–53)
AST: 25 U/L (ref 0–37)
Albumin: 4.2 g/dL (ref 3.5–5.2)
Alkaline Phosphatase: 78 U/L (ref 39–117)
Bilirubin, Direct: 0.1 mg/dL (ref 0.0–0.3)
Total Bilirubin: 0.8 mg/dL (ref 0.2–1.2)
Total Protein: 6.9 g/dL (ref 6.0–8.3)

## 2022-10-03 MED ORDER — CYANOCOBALAMIN 1000 MCG/ML IJ SOLN
1000.0000 ug | Freq: Once | INTRAMUSCULAR | Status: AC
  Administered 2022-10-03 (×2): 1000 ug via INTRAMUSCULAR

## 2022-10-03 NOTE — Progress Notes (Signed)
Patient was administered a B12 injection in his Right Arm. Patient tolerated injection well. Patient did not show signs of distress or voice any concerns.

## 2022-10-04 ENCOUNTER — Other Ambulatory Visit: Payer: Self-pay

## 2022-10-04 DIAGNOSIS — E119 Type 2 diabetes mellitus without complications: Secondary | ICD-10-CM

## 2022-10-10 ENCOUNTER — Ambulatory Visit (INDEPENDENT_AMBULATORY_CARE_PROVIDER_SITE_OTHER): Payer: 59

## 2022-10-10 DIAGNOSIS — E538 Deficiency of other specified B group vitamins: Secondary | ICD-10-CM

## 2022-10-10 MED ORDER — CYANOCOBALAMIN 1000 MCG/ML IJ SOLN
1000.0000 ug | Freq: Once | INTRAMUSCULAR | Status: AC
  Administered 2022-10-10: 1000 ug via INTRAMUSCULAR

## 2022-10-10 NOTE — Progress Notes (Signed)
presents today for injection per MD orders. B12 injection administered IM in left Upper Arm. Administration without incident. Patient tolerated well.  Heide Brossart,cma   

## 2022-10-17 ENCOUNTER — Ambulatory Visit (INDEPENDENT_AMBULATORY_CARE_PROVIDER_SITE_OTHER): Payer: 59

## 2022-10-17 DIAGNOSIS — E538 Deficiency of other specified B group vitamins: Secondary | ICD-10-CM

## 2022-10-17 MED ORDER — CYANOCOBALAMIN 1000 MCG/ML IJ SOLN
1000.0000 ug | Freq: Once | INTRAMUSCULAR | Status: AC
  Administered 2022-10-17: 1000 ug via INTRAMUSCULAR

## 2022-10-17 NOTE — Progress Notes (Signed)
Patient presented for B 12 injection to RIGHT deltoid, patient voiced no concerns nor showed any signs of distress during injection  

## 2022-10-23 ENCOUNTER — Ambulatory Visit: Payer: 59 | Admitting: Family

## 2022-10-24 ENCOUNTER — Ambulatory Visit: Payer: 59

## 2022-10-25 ENCOUNTER — Ambulatory Visit: Payer: 59 | Admitting: Family

## 2022-10-25 ENCOUNTER — Encounter: Payer: Self-pay | Admitting: Family

## 2022-10-25 VITALS — BP 118/76 | HR 103 | Temp 98.1°F | Ht 72.0 in | Wt 216.4 lb

## 2022-10-25 DIAGNOSIS — E119 Type 2 diabetes mellitus without complications: Secondary | ICD-10-CM

## 2022-10-25 DIAGNOSIS — R7309 Other abnormal glucose: Secondary | ICD-10-CM | POA: Diagnosis not present

## 2022-10-25 DIAGNOSIS — R899 Unspecified abnormal finding in specimens from other organs, systems and tissues: Secondary | ICD-10-CM | POA: Diagnosis not present

## 2022-10-25 DIAGNOSIS — I10 Essential (primary) hypertension: Secondary | ICD-10-CM

## 2022-10-25 DIAGNOSIS — E538 Deficiency of other specified B group vitamins: Secondary | ICD-10-CM

## 2022-10-25 LAB — GLUCOSE, POCT (MANUAL RESULT ENTRY): POC Glucose: 192 mg/dl — AB (ref 70–99)

## 2022-10-25 LAB — POCT GLYCOSYLATED HEMOGLOBIN (HGB A1C): Hemoglobin A1C: 9.6 % — AB (ref 4.0–5.6)

## 2022-10-25 MED ORDER — CYANOCOBALAMIN 1000 MCG/ML IJ SOLN
1000.0000 ug | Freq: Once | INTRAMUSCULAR | Status: AC
  Administered 2022-10-25: 1000 ug via INTRAMUSCULAR

## 2022-10-25 MED ORDER — RYBELSUS 7 MG PO TABS: 7.0000 mg | ORAL_TABLET | Freq: Every day | ORAL | 2 refills | Status: DC

## 2022-10-25 NOTE — Progress Notes (Unsigned)
Assessment & Plan:  Type 2 diabetes mellitus without complication, without long-term current use of insulin (HCC) Assessment & Plan: Lab Results  Component Value Date   HGBA1C 9.6 (A) 10/25/2022   Uncontrolled.  As patient is only been on Rybelsus for 2 weeks and has missed doses, encouraged him to be compliant with taking his medication daily.  For now we will not increase Rybelsus to 14 mg.  Continue Rybelsus 7 mg.  Continue metformin 1000 mg twice daily.  Counseled how to take Rybelsus and side effects of Rybelsus.  Advised patient to spot check blood pressure particularly when he feels blood sugar is elevated.  Orders: -     Rybelsus; Take 1 tablet (7 mg total) by mouth daily.  Dispense: 30 tablet; Refill: 2  Elevated glucose -     POCT glycosylated hemoglobin (Hb A1C) -     POCT glucose (manual entry)  B12 deficiency -     Cyanocobalamin  Abnormal laboratory test -     CBC with Differential/Platelet  Essential hypertension Assessment & Plan: Chronic, stable.  Continue amlodipine 10 mg, hydrochlorothiazide 12.5 mg, potassium chloride 10 meq , telmisartan 40mg .      Return precautions given.   Risks, benefits, and alternatives of the medications and treatment plan prescribed today were discussed, and patient expressed understanding.   Education regarding symptom management and diagnosis given to patient on AVS either electronically or printed.  No follow-ups on file.  Mable Paris, FNP  Subjective:    Patient ID: Thomas Brandt, male    DOB: 02/06/1961, 62 y.o.   MRN: [id number]  CC: Thomas Brandt is a 62 y.o. male who presents today for follow up.   HPI: Feels well today.  No new complaints.  He suspects blood sugar is elevated.  He has not been checking glucose at home.   DM-compliant with Rybelsus 7 mg, metformin 1000 mg twice daily. He hasn't been taking glucose at home.  He notes that he just tarted Rybelsus 7 mg 2 weeks ago and has been missing  doses.  So far he is tolerating medication  due for B12 injection today  Hypertension-compliant with  amlodipine 10 mg, hydrochlorothiazide 12.5 mg, potassium chloride 10 meq , telmisartan 40mg .  No cp, sob  Allergies: Patient has no known allergies. Current Outpatient Medications on File Prior to Visit  Medication Sig Dispense Refill   amLODipine (NORVASC) 10 MG tablet Take 1 tablet (10 mg total) by mouth daily. 90 tablet 3   aspirin 81 MG chewable tablet Chew by mouth daily.     cetirizine (ZYRTEC) 10 MG tablet TAKE 1 TABLET BY MOUTH EVERY DAY 90 tablet 3   Clotrimazole 1 % OINT Apply 1 application topically 2 (two) times daily. 56.7 g 1   gabapentin (NEURONTIN) 100 MG capsule Take 1 capsule (100 mg total) by mouth at bedtime. 90 capsule 3   hydrochlorothiazide (MICROZIDE) 12.5 MG capsule Take 1 capsule (12.5 mg total) by mouth daily. 90 capsule 3   metFORMIN (GLUCOPHAGE) 1000 MG tablet TAKE 1 TABLET (1,000 MG TOTAL) BY MOUTH 2 (TWO) TIMES DAILY WITH A MEAL. 180 tablet 3   montelukast (SINGULAIR) 10 MG tablet TAKE 1 TABLET BY MOUTH EVERYDAY AT BEDTIME 90 tablet 1   nystatin-triamcinolone ointment (MYCOLOG) Apply 1 Application topically 2 (two) times daily as needed. 30 g 3   potassium chloride (KLOR-CON) 10 MEQ tablet TAKE 1 TABLET (10 MEQ TOTAL) BY MOUTH EVERY OTHER DAY. 45 tablet 2  rosuvastatin (CRESTOR) 20 MG tablet TAKE 1 TABLET BY MOUTH EVERY DAY 90 tablet 1   SYMBICORT 160-4.5 MCG/ACT inhaler TAKE 2 PUFFS BY MOUTH TWICE A DAY 30.6 each 3   telmisartan (MICARDIS) 40 MG tablet Take 1 tablet (40 mg total) by mouth daily. 90 tablet 3   Tiotropium Bromide Monohydrate (SPIRIVA RESPIMAT) 2.5 MCG/ACT AERS INHALE 2 PUFFS BY MOUTH INTO THE LUNGS DAILY 4 g 3   Current Facility-Administered Medications on File Prior to Visit  Medication Dose Route Frequency Provider Last Rate Last Admin   albuterol (PROVENTIL) (2.5 MG/3ML) 0.083% nebulizer solution 2.5 mg  2.5 mg Nebulization Once Tyler Pita, MD        Review of Systems  Constitutional:  Negative for chills and fever.  Respiratory:  Negative for cough.   Cardiovascular:  Negative for chest pain and palpitations.  Gastrointestinal:  Negative for nausea and vomiting.      Objective:    BP 118/76   Pulse (!) 103   Temp 98.1 F (36.7 C) (Oral)   Ht 6' (1.829 m)   Wt 216 lb 6.4 oz (98.2 kg)   SpO2 97%   BMI 29.35 kg/m  BP Readings from Last 3 Encounters:  10/25/22 118/76  07/24/22 130/78  04/19/22 130/70   Wt Readings from Last 3 Encounters:  10/25/22 216 lb 6.4 oz (98.2 kg)  07/24/22 223 lb 9.6 oz (101.4 kg)  04/19/22 227 lb 9.6 oz (103.2 kg)    Physical Exam Vitals reviewed.  Constitutional:      Appearance: He is well-developed.  Cardiovascular:     Rate and Rhythm: Regular rhythm.     Heart sounds: Normal heart sounds.  Pulmonary:     Effort: Pulmonary effort is normal. No respiratory distress.     Breath sounds: Normal breath sounds. No wheezing, rhonchi or rales.  Skin:    General: Skin is warm and dry.  Neurological:     Mental Status: He is alert.  Psychiatric:        Speech: Speech normal.        Behavior: Behavior normal.

## 2022-10-28 ENCOUNTER — Telehealth: Payer: Self-pay | Admitting: Family

## 2022-10-28 MED ORDER — ACCU-CHEK SOFTCLIX LANCETS MISC: 12 refills | Status: AC

## 2022-10-28 MED ORDER — GLUCOSE BLOOD VI STRP: ORAL_STRIP | 12 refills | Status: DC

## 2022-10-28 NOTE — Assessment & Plan Note (Addendum)
Lab Results  Component Value Date   HGBA1C 9.6 (A) 10/25/2022   Uncontrolled.  As patient is only been on Rybelsus for 2 weeks and has missed doses, encouraged him to be compliant with taking his medication daily.  For now we will not increase Rybelsus to 14 mg.  Continue Rybelsus 7 mg.  Continue metformin 1000 mg twice daily.  Counseled how to take Rybelsus and side effects of Rybelsus.  Advised patient to spot check blood pressure particularly when he feels blood sugar is elevated.

## 2022-10-28 NOTE — Telephone Encounter (Signed)
Spoke to pt and confirmed that he is still taking the 7 mg Rybelsus.

## 2022-10-28 NOTE — Telephone Encounter (Signed)
Thomas Brandt  Please disregard previous note and confirm that you didn't call pt He needs to stay on rybelsus 7mg  as he just recently started.

## 2022-10-28 NOTE — Assessment & Plan Note (Signed)
Chronic, stable.  Continue amlodipine 10 mg, hydrochlorothiazide 12.5 mg, potassium chloride 10 meq , telmisartan 40mg .

## 2022-10-28 NOTE — Telephone Encounter (Signed)
Call pt  I wanted to confirm dose of rybelsus He had been on rybelsus 7mg  prior to our Friday visit 10/25/22  A1c was 9.6 so we were going to increase so he would then need rybelsus 14mg  QD. Can you send in once you confirm with patient?   Please mail below in regards to HOW to take medication.   Also, with the Rybelsus, be very sure that you take on an empty stomach with a little bit of water and take 30 minutes before eating. Do not take with other medicines and no other food with medication. This is important for effectiveness.   Remember black box warning that you may not take this medication if you or a family member is diagnosed with thyroid cancer.    Semaglutide Oral Tablets What is this medicine? SEMAGLUTIDE (Sem a GLOO tide) controls blood sugar in people with type 2 diabetes. It is used with lifestyle changes like diet and exercise. This medicine may be used for other purposes; ask your health care provider or pharmacist if you have questions. COMMON BRAND NAME(S): Rybelsus What should I tell my health care provider before I take this medicine? They need to know if you have any of these conditions: endocrine tumors (MEN 2) or if someone in your family had these tumors eye disease history of pancreatitis kidney disease stomach or intestine problems thyroid cancer or if someone in your family had thyroid cancer vision problems an unusual or allergic reaction to semaglutide, other medicines, foods, dyes, or preservatives pregnant or trying to get pregnant breast-feeding How should I use this medicine? Take this medicine by mouth. Take it as directed on the prescription label at the same time every day. Take the dose right after waking up. Do not eat or drink anything before taking it. Do not take it with any other drink except a glass of plain water that is less than 4 ounces (less than 120 mL). Do not cut, crush or chew this medicine. Swallow the tablets whole. After taking it,  do not eat breakfast, drink, or take any other medicines or vitamins for at least 30 minutes. Keep taking it unless your health care provider tells you to stop. A special MedGuide will be given to you by the pharmacist with each prescription and refill. Be sure to read this information carefully each time. Talk to your health care provider about the use of this medicine in children. Special care may be needed. Overdosage: If you think you have taken too much of this medicine contact a poison control center or emergency room at once. NOTE: This medicine is only for you. Do not share this medicine with others. What if I miss a dose? If you miss a dose, skip it. Take your next dose at the normal time. Do not take extra or 2 doses at the same time to make up for the missed dose. What may interact with this medicine? What may interact with this medicine? aminophylline carbamazepine cyclosporine digoxin levothyroxine other medicines for diabetes phenytoin tacrolimus theophylline warfarin Many medications may cause changes in blood sugar, these include: alcohol containing beverages antiviral medicines for HIV or AIDS aspirin and aspirin-like drugs certain medicines for blood pressure, heart disease, irregular heart beat chromium diuretics male hormones, such as estrogens or progestins, birth control pills fenofibrate gemfibrozil isoniazid lanreotide male hormones or anabolic steroids MAOIs like Carbex, Eldepryl, Marplan, Nardil, and Parnate medicines for weight loss medicines for allergies, asthma, cold, or cough medicines for depression, anxiety, or  psychotic disturbances niacin nicotine NSAIDs, medicines for pain and inflammation, like ibuprofen or naproxen octreotide pasireotide pentamidine phenytoin probenecid quinolone antibiotics such as ciprofloxacin, levofloxacin, ofloxacin some herbal dietary supplements steroid medicines such as prednisone or  cortisone sulfamethoxazole; trimethoprim thyroid hormones Some medications can hide the warning symptoms of low blood sugar (hypoglycemia). You may need to monitor your blood sugar more closely if you are taking one of these medications. These include: beta-blockers, often used for high blood pressure or heart problems (examples include atenolol, metoprolol, propranolol) clonidine guanethidine reserpine This list may not describe all possible interactions. Give your health care provider a list of all the medicines, herbs, non-prescription drugs, or dietary supplements you use. Also tell them if you smoke, drink alcohol, or use illegal drugs. Some items may interact with your medicine. What should I watch for while using this medicine? Visit your health care provider for regular checks on your progress. Check with your health care provider if you have severe diarrhea, nausea, and vomiting, or if you sweat a lot. The loss of too much body fluid may make it dangerous for you to take this medicine. A test called the HbA1C (A1C) will be monitored. This is a simple blood test. It measures your blood sugar control over the last 2 to 3 months. You will receive this test every 3 to 6 months. Learn how to check your blood sugar. Learn the symptoms of low and high blood sugar and how to manage them. Always carry a quick-source of sugar with you in case you have symptoms of low blood sugar. Examples include hard sugar candy or glucose tablets. Make sure others know that you can choke if you eat or drink when you develop serious symptoms of low blood sugar, such as seizures or unconsciousness. Get medical help at once. Tell your health care provider if you have high blood sugar. You might need to change the dose of your medicine. If you are sick or exercising more than usual, you might need to change the dose of your medicine. Do not skip meals. Ask your health care provider if you should avoid alcohol. Many  nonprescription cough and cold products contain sugar or alcohol. These can affect blood sugar. Wear a medical ID bracelet or chain. Carry a card that describes your condition. List the medicines and doses you take on the card. Do not become pregnant while taking this medicine. Women should inform their health care provider if they wish to become pregnant or think they might be pregnant. There is a potential for serious side effects to an unborn child. Talk to your health care provider for more information. Do not breast-feed an infant while taking this medicine. What side effects may I notice from receiving this medicine? Side effects that you should report to your doctor or health care provider as soon as possible: allergic reactions (skin rash, itching or hives; swelling of the face, lips, or tongue) changes in vision diarrhea that continues or is severe infection (fever, chills, cough, sore throat, pain or trouble passing urine) kidney injury (trouble passing urine or change in the amount of urine) low blood sugar (feeling anxious; confusion; dizziness; increased hunger; unusually weak or tired; increased sweating; shakiness; cold, clammy skin; irritable; headache; blurred vision; fast heartbeat; loss of consciousness) lump or swelling on the neck painful or difficulty swallowing severe nausea severe or unusual stomach pain trouble breathing vomiting Side effects that usually do not require medical attention (report these to your doctor or health care  provider if they continue or are bothersome): constipation diarrhea nausea upset stomach This list may not describe all possible side effects. Call your doctor for medical advice about side effects. You may report side effects to FDA at 1-800-FDA-1088. Where should I keep my medicine? Keep out of the reach of children and pets. Store at room temperature between 20 and 25 degrees C (68 and 77 degrees F). Keep this medicine in the original  container. Protect from moisture. Keep the container tightly closed. Get rid of any unused medicine after the expiration date. To get rid of medicines that are no longer needed or have expired: Take the medicine to a medicine take-back program. Check with your pharmacy or law enforcement to find a location. If you cannot return the medicine, check the label or package insert to see if the medicine should be thrown out in the garbage or flushed down the toilet. If you are not sure, ask your health care provider. If it is safe to put it in the trash, take the medicine out of the container. Mix the medicine with cat litter, dirt, coffee grounds, or other unwanted substance. Seal the mixture in a bag or container. Put it in the trash. NOTE: This sheet is a summary. It may not cover all possible information. If you have questions about this medicine, talk to your doctor, pharmacist, or health care provider.  2021 Elsevier/Gold Standard (2020-08-14 15:08:33)

## 2022-11-14 ENCOUNTER — Other Ambulatory Visit (INDEPENDENT_AMBULATORY_CARE_PROVIDER_SITE_OTHER): Payer: 59

## 2022-11-14 DIAGNOSIS — E119 Type 2 diabetes mellitus without complications: Secondary | ICD-10-CM

## 2022-11-14 LAB — CBC WITH DIFFERENTIAL/PLATELET
Absolute Monocytes: 792 cells/uL (ref 200–950)
Basophils Absolute: 69 cells/uL (ref 0–200)
Basophils Relative: 0.7 %
Eosinophils Absolute: 1544 cells/uL — ABNORMAL HIGH (ref 15–500)
Eosinophils Relative: 15.6 %
HCT: 41.4 % (ref 38.5–50.0)
Hemoglobin: 13.7 g/dL (ref 13.2–17.1)
Lymphs Abs: 3307 cells/uL (ref 850–3900)
MCH: 29 pg (ref 27.0–33.0)
MCHC: 33.1 g/dL (ref 32.0–36.0)
MCV: 87.5 fL (ref 80.0–100.0)
MPV: 9.4 fL (ref 7.5–12.5)
Monocytes Relative: 8 %
Neutro Abs: 4188 cells/uL (ref 1500–7800)
Neutrophils Relative %: 42.3 %
Platelets: 280 10*3/uL (ref 140–400)
RBC: 4.73 10*6/uL (ref 4.20–5.80)
RDW: 13.7 % (ref 11.0–15.0)
Total Lymphocyte: 33.4 %
WBC: 9.9 10*3/uL (ref 3.8–10.8)

## 2022-11-14 LAB — HEMOGLOBIN A1C: Hgb A1c MFr Bld: 9.4 % — ABNORMAL HIGH (ref 4.6–6.5)

## 2022-11-15 ENCOUNTER — Other Ambulatory Visit: Payer: Self-pay | Admitting: Family

## 2022-11-15 DIAGNOSIS — D721 Eosinophilia, unspecified: Secondary | ICD-10-CM

## 2022-11-20 ENCOUNTER — Other Ambulatory Visit: Payer: Self-pay | Admitting: Family

## 2022-11-20 DIAGNOSIS — E119 Type 2 diabetes mellitus without complications: Secondary | ICD-10-CM

## 2022-11-20 DIAGNOSIS — I10 Essential (primary) hypertension: Secondary | ICD-10-CM

## 2022-11-25 ENCOUNTER — Telehealth: Payer: Self-pay | Admitting: Family

## 2022-11-25 ENCOUNTER — Ambulatory Visit (INDEPENDENT_AMBULATORY_CARE_PROVIDER_SITE_OTHER): Payer: 59

## 2022-11-25 DIAGNOSIS — E538 Deficiency of other specified B group vitamins: Secondary | ICD-10-CM

## 2022-11-25 MED ORDER — CYANOCOBALAMIN 1000 MCG/ML IJ SOLN
1000.0000 ug | Freq: Once | INTRAMUSCULAR | Status: AC
  Administered 2022-11-25: 1000 ug via INTRAMUSCULAR

## 2022-11-25 NOTE — Progress Notes (Signed)
Patient presented for a B12 injection. Patient was administered a B12 injection in his Left deltoid. Patient tolerated the B12 injection well and did not show any signs of distress or voice any concerns.

## 2022-11-25 NOTE — Telephone Encounter (Signed)
Pt called in staying that he would like to see Dr.Arnett today since he's coming in for his B-12, however, she's booked for the day. Per pt, he's allergies are bothering him and if its possible for Dr. Vidal Schwalbe to prescribed him some meds for this matter?

## 2022-11-25 NOTE — Telephone Encounter (Signed)
Spoke to pt and he scheduled appt with you for tomorrow 11/25/22...wants to discuss coming off Rybelsus also

## 2022-11-26 ENCOUNTER — Encounter: Payer: Self-pay | Admitting: Family

## 2022-11-26 ENCOUNTER — Ambulatory Visit: Payer: 59 | Admitting: Family

## 2022-11-26 VITALS — BP 138/82 | HR 92 | Temp 97.6°F | Ht 72.0 in | Wt 213.0 lb

## 2022-11-26 DIAGNOSIS — J0100 Acute maxillary sinusitis, unspecified: Secondary | ICD-10-CM

## 2022-11-26 DIAGNOSIS — E119 Type 2 diabetes mellitus without complications: Secondary | ICD-10-CM

## 2022-11-26 DIAGNOSIS — R899 Unspecified abnormal finding in specimens from other organs, systems and tissues: Secondary | ICD-10-CM | POA: Diagnosis not present

## 2022-11-26 DIAGNOSIS — J329 Chronic sinusitis, unspecified: Secondary | ICD-10-CM | POA: Insufficient documentation

## 2022-11-26 MED ORDER — ACCU-CHEK GUIDE W/DEVICE KIT: PACK | 2 refills | Status: AC

## 2022-11-26 MED ORDER — GLIPIZIDE 5 MG PO TABS: 5.0000 mg | ORAL_TABLET | Freq: Two times a day (BID) | ORAL | 3 refills | Status: DC

## 2022-11-26 MED ORDER — AMOXICILLIN-POT CLAVULANATE 875-125 MG PO TABS: 1.0000 | ORAL_TABLET | Freq: Two times a day (BID) | ORAL | 0 refills | Status: AC

## 2022-11-26 MED ORDER — ACCU-CHEK SOFTCLIX LANCETS MISC: 12 refills | Status: AC

## 2022-11-26 NOTE — Assessment & Plan Note (Signed)
Duration 3 months, worsening past 3 days.  Discussed concern for bacterial sinusitis based on worsening and also eosinophilia identified in labs.  We opted to start with Augmentin to treat for bacterial sinusitis.  Advised probiotics. Patient would like to move out appointment with Dr Tasia Catchings this week until he is completed antibiotics and CBC w/diff rechecked.  Advised this is appropriate.

## 2022-11-26 NOTE — Assessment & Plan Note (Signed)
Uncontrolled. Intolerant to rybelsus. Continue metformin '1000mg'$  bid.  Patient doesn't want to start insulin. I deferred starting jardiance due to previous A1c and risk of dehydration, UTI until we have better control of blood sugar. Start glipizide '5mg'$  BID with meals. New glucometer prescribed. He will check daily FBG. Close follow up.

## 2022-11-26 NOTE — Patient Instructions (Addendum)
Start augmentin.   Ensure to take probiotics while on antibiotics and also for 2 weeks after completion. This can either be by eating yogurt daily or taking a probiotic supplement over the counter such as Culturelle.It is important to re-colonize the gut with good bacteria and also to prevent any diarrheal infections associated with antibiotic use.   STOP rybelsus.   Start glipizide; as discussed, it is very important you take glipizide with a meal.  If you are skipping a meal, please not take glipizide as it will cause a low blood sugar.  Please continue metformin  Please check fasting blood sugar every day and write down on your log.  Goal is between 80 and 120  We will look at lab work again in approximately 10 days after you have completed antibiotic

## 2022-11-26 NOTE — Progress Notes (Signed)
Assessment & Plan:  Acute non-recurrent maxillary sinusitis Assessment & Plan: Duration 3 months, worsening past 3 days.  Discussed concern for bacterial sinusitis based on worsening and also eosinophilia identified in labs.  We opted to start with Augmentin to treat for bacterial sinusitis.  Advised probiotics. Patient would like to move out appointment with Dr Tasia Catchings this week until he is completed antibiotics and CBC w/diff rechecked.  Advised this is appropriate.   Orders: -     Amoxicillin-Pot Clavulanate; Take 1 tablet by mouth 2 (two) times daily for 7 days.  Dispense: 14 tablet; Refill: 0  Type 2 diabetes mellitus without complication, without long-term current use of insulin ([place]) Assessment & Plan: Uncontrolled. Intolerant to rybelsus. Continue metformin '1000mg'$  bid.  Patient doesn't want to start insulin. I deferred starting jardiance due to previous A1c and risk of dehydration, UTI until we have better control of blood sugar. Start glipizide '5mg'$  BID with meals. New glucometer prescribed. He will check daily FBG. Close follow up.   Orders: -     glipiZIDE; Take 1 tablet (5 mg total) by mouth 2 (two) times daily before a meal.  Dispense: 60 tablet; Refill: 3  Abnormal laboratory test -     CBC with Differential/Platelet; Future  Other orders -     Accu-Chek Guide; Use to check blood sugars  Dispense: 1 kit; Refill: 2 -     Accu-Chek Softclix Lancets; Use as instructed  Dispense: 100 each; Refill: 12     Return precautions given.   Risks, benefits, and alternatives of the medications and treatment plan prescribed today were discussed, and patient expressed understanding.   Education regarding symptom management and diagnosis given to patient on AVS either electronically or printed.  Return in about 2 months (around 01/25/2023).  Mable Paris, FNP  Subjective:    Patient ID: Thomas Brandt, male    DOB: 11/14/1960, 62 y.o.   MRN: [id number]  CC: KAYGE BOURGEOIS is a  62 y.o. male who presents today for an acute visit.    HPI: Complains of thick green nasal congestion for 3 months, but then last 3 days, it has gotten worse  No fever, sinus pain , ear pain, cough, sob, wheezing.   Occasional sore throat    Compliant zyrtec and singulair.   No recent antibiotics.   Previous A1c 9.4 . Endorses epigastric burning and excessive gas on rybelsus.  No constipation.  Compliant with metformin '1000mg'$  BID He is not checking blood sugars at home; he has older glucometer    He has an appointment with Dr Tasia Catchings 11/28/22 Allergies: Semaglutide Current Outpatient Medications on File Prior to Visit  Medication Sig Dispense Refill   Accu-Chek Softclix Lancets lancets Use as instructed 100 each 12   amLODipine (NORVASC) 10 MG tablet TAKE 1 TABLET BY MOUTH EVERY DAY 90 tablet 3   aspirin 81 MG chewable tablet Chew by mouth daily.     cetirizine (ZYRTEC) 10 MG tablet TAKE 1 TABLET BY MOUTH EVERY DAY 90 tablet 3   Clotrimazole 1 % OINT Apply 1 application topically 2 (two) times daily. 56.7 g 1   gabapentin (NEURONTIN) 100 MG capsule Take 1 capsule (100 mg total) by mouth at bedtime. 90 capsule 3   hydrochlorothiazide (MICROZIDE) 12.5 MG capsule Take 1 capsule (12.5 mg total) by mouth daily. 90 capsule 3   metFORMIN (GLUCOPHAGE) 1000 MG tablet TAKE 1 TABLET (1,000 MG TOTAL) BY MOUTH 2 (TWO) TIMES DAILY WITH A MEAL. 180 tablet  3   montelukast (SINGULAIR) 10 MG tablet TAKE 1 TABLET BY MOUTH EVERYDAY AT BEDTIME 90 tablet 1   nystatin-triamcinolone ointment (MYCOLOG) Apply 1 Application topically 2 (two) times daily as needed. 30 g 3   potassium chloride (KLOR-CON) 10 MEQ tablet TAKE 1 TABLET (10 MEQ TOTAL) BY MOUTH EVERY OTHER DAY. 45 tablet 2   rosuvastatin (CRESTOR) 20 MG tablet TAKE 1 TABLET BY MOUTH EVERY DAY 90 tablet 1   SYMBICORT 160-4.5 MCG/ACT inhaler TAKE 2 PUFFS BY MOUTH TWICE A DAY 30.6 each 3   telmisartan (MICARDIS) 40 MG tablet TAKE 1 TABLET BY MOUTH EVERY  DAY 90 tablet 3   Tiotropium Bromide Monohydrate (SPIRIVA RESPIMAT) 2.5 MCG/ACT AERS INHALE 2 PUFFS BY MOUTH INTO THE LUNGS DAILY 4 g 3   Current Facility-Administered Medications on File Prior to Visit  Medication Dose Route Frequency Provider Last Rate Last Admin   albuterol (PROVENTIL) (2.5 MG/3ML) 0.083% nebulizer solution 2.5 mg  2.5 mg Nebulization Once Tyler Pita, MD        Review of Systems  Constitutional:  Negative for chills and fever.  HENT:  Positive for congestion and sore throat.   Respiratory:  Negative for cough.   Cardiovascular:  Negative for chest pain and palpitations.  Gastrointestinal:  Negative for nausea and vomiting.      Objective:    BP 138/82   Pulse 92   Temp 97.6 F (36.4 C) (Oral)   Ht 6' (1.829 m)   Wt 213 lb (96.6 kg)   SpO2 97%   BMI 28.89 kg/m   BP Readings from Last 3 Encounters:  11/26/22 138/82  10/25/22 118/76  07/24/22 130/78   Wt Readings from Last 3 Encounters:  11/26/22 213 lb (96.6 kg)  10/25/22 216 lb 6.4 oz (98.2 kg)  07/24/22 223 lb 9.6 oz (101.4 kg)    Physical Exam Vitals reviewed.  Constitutional:      Appearance: He is well-developed.  HENT:     Head: Normocephalic and atraumatic.     Right Ear: Hearing, tympanic membrane, ear canal and external ear normal. No decreased hearing noted. No drainage, swelling or tenderness. No middle ear effusion. Tympanic membrane is not injected, erythematous or bulging.     Left Ear: Hearing, tympanic membrane, ear canal and external ear normal. No decreased hearing noted. No drainage, swelling or tenderness.  No middle ear effusion. Tympanic membrane is not injected, erythematous or bulging.     Nose: Nose normal.     Right Sinus: No maxillary sinus tenderness or frontal sinus tenderness.     Left Sinus: No maxillary sinus tenderness or frontal sinus tenderness.     Mouth/Throat:     Pharynx: Uvula midline. No oropharyngeal exudate or posterior oropharyngeal erythema.      Tonsils: No tonsillar abscesses.  Eyes:     Conjunctiva/sclera: Conjunctivae normal.  Cardiovascular:     Rate and Rhythm: Regular rhythm.     Heart sounds: Normal heart sounds.  Pulmonary:     Effort: Pulmonary effort is normal. No respiratory distress.     Breath sounds: Normal breath sounds. No wheezing, rhonchi or rales.  Lymphadenopathy:     Head:     Right side of head: No submental, submandibular, tonsillar, preauricular, posterior auricular or occipital adenopathy.     Left side of head: No submental, submandibular, tonsillar, preauricular, posterior auricular or occipital adenopathy.     Cervical: No cervical adenopathy.  Skin:    General: Skin is warm and dry.  Neurological:     Mental Status: He is alert.  Psychiatric:        Speech: Speech normal.        Behavior: Behavior normal.

## 2022-11-28 ENCOUNTER — Inpatient Hospital Stay: Payer: 59

## 2022-11-28 ENCOUNTER — Inpatient Hospital Stay: Payer: 59 | Admitting: Oncology

## 2022-12-06 ENCOUNTER — Other Ambulatory Visit: Payer: 59

## 2022-12-09 ENCOUNTER — Other Ambulatory Visit (INDEPENDENT_AMBULATORY_CARE_PROVIDER_SITE_OTHER): Payer: 59

## 2022-12-09 ENCOUNTER — Telehealth: Payer: Self-pay

## 2022-12-09 DIAGNOSIS — R899 Unspecified abnormal finding in specimens from other organs, systems and tissues: Secondary | ICD-10-CM | POA: Diagnosis not present

## 2022-12-09 LAB — CBC WITH DIFFERENTIAL/PLATELET
Basophils Absolute: 0.1 10*3/uL (ref 0.0–0.1)
Basophils Relative: 0.7 % (ref 0.0–3.0)
Eosinophils Absolute: 1.6 10*3/uL — ABNORMAL HIGH (ref 0.0–0.7)
Eosinophils Relative: 12.5 % — ABNORMAL HIGH (ref 0.0–5.0)
HCT: 41.3 % (ref 39.0–52.0)
Hemoglobin: 13.8 g/dL (ref 13.0–17.0)
Lymphocytes Relative: 26.4 % (ref 12.0–46.0)
Lymphs Abs: 3.3 10*3/uL (ref 0.7–4.0)
MCHC: 33.4 g/dL (ref 30.0–36.0)
MCV: 84.9 fl (ref 78.0–100.0)
Monocytes Absolute: 0.9 10*3/uL (ref 0.1–1.0)
Monocytes Relative: 7.6 % (ref 3.0–12.0)
Neutro Abs: 6.6 10*3/uL (ref 1.4–7.7)
Neutrophils Relative %: 52.8 % (ref 43.0–77.0)
Platelets: 288 10*3/uL (ref 150.0–400.0)
RBC: 4.86 Mil/uL (ref 4.22–5.81)
RDW: 14.8 % (ref 11.5–15.5)
WBC: 12.5 10*3/uL — ABNORMAL HIGH (ref 4.0–10.5)

## 2022-12-09 NOTE — Telephone Encounter (Signed)
noted 

## 2022-12-09 NOTE — Telephone Encounter (Signed)
Spoke to pt and he stated that he has finished the Augmentin and has no facial pn or fever. Symptoms just started  coming back after he completed Augmentin. Pt will pick up otc nasal spray and begin it and let us know if symptoms persist or get worse

## 2022-12-09 NOTE — Telephone Encounter (Signed)
Patient states at check-in for his lab appointment today that his sinuses are better but they still are not clear.  Patient states he would like to know what Mable Paris, NP, recommends.

## 2022-12-24 ENCOUNTER — Ambulatory Visit: Payer: 59 | Admitting: Family

## 2022-12-24 ENCOUNTER — Ambulatory Visit (INDEPENDENT_AMBULATORY_CARE_PROVIDER_SITE_OTHER): Payer: 59

## 2022-12-24 DIAGNOSIS — E538 Deficiency of other specified B group vitamins: Secondary | ICD-10-CM

## 2022-12-24 MED ORDER — CYANOCOBALAMIN 1000 MCG/ML IJ SOLN
1000.0000 ug | Freq: Once | INTRAMUSCULAR | Status: AC
  Administered 2022-12-24: 1000 ug via INTRAMUSCULAR

## 2022-12-24 NOTE — Progress Notes (Signed)
presents today for injection per MD orders. B12 injection administered IM in left Upper Arm. Administration without incident. Patient tolerated well.  Homero Hyson,cma   

## 2022-12-25 ENCOUNTER — Other Ambulatory Visit: Payer: Self-pay | Admitting: Family

## 2022-12-25 DIAGNOSIS — E119 Type 2 diabetes mellitus without complications: Secondary | ICD-10-CM

## 2022-12-25 DIAGNOSIS — E782 Mixed hyperlipidemia: Secondary | ICD-10-CM

## 2022-12-25 DIAGNOSIS — I10 Essential (primary) hypertension: Secondary | ICD-10-CM

## 2023-01-02 ENCOUNTER — Telehealth: Payer: Self-pay | Admitting: Family

## 2023-01-02 DIAGNOSIS — E119 Type 2 diabetes mellitus without complications: Secondary | ICD-10-CM

## 2023-01-02 NOTE — Addendum Note (Signed)
Addended by: [place], Branch Pacitti on: 01/02/2023 01:54 PM   Modules accepted: Orders

## 2023-01-02 NOTE — Telephone Encounter (Signed)
Patient has a lab a lab appt 01/07/2023, there are No in.

## 2023-01-07 ENCOUNTER — Other Ambulatory Visit (INDEPENDENT_AMBULATORY_CARE_PROVIDER_SITE_OTHER): Payer: 59

## 2023-01-07 DIAGNOSIS — E119 Type 2 diabetes mellitus without complications: Secondary | ICD-10-CM | POA: Diagnosis not present

## 2023-01-07 LAB — CBC WITH DIFFERENTIAL/PLATELET
Basophils Absolute: 0.1 10*3/uL (ref 0.0–0.1)
Basophils Relative: 1 % (ref 0.0–3.0)
Eosinophils Absolute: 1.9 10*3/uL — ABNORMAL HIGH (ref 0.0–0.7)
Eosinophils Relative: 16.1 % — ABNORMAL HIGH (ref 0.0–5.0)
HCT: 41.4 % (ref 39.0–52.0)
Hemoglobin: 13.7 g/dL (ref 13.0–17.0)
Lymphocytes Relative: 28.2 % (ref 12.0–46.0)
Lymphs Abs: 3.3 10*3/uL (ref 0.7–4.0)
MCHC: 33.2 g/dL (ref 30.0–36.0)
MCV: 86.4 fl (ref 78.0–100.0)
Monocytes Absolute: 0.7 10*3/uL (ref 0.1–1.0)
Monocytes Relative: 6.3 % (ref 3.0–12.0)
Neutro Abs: 5.6 10*3/uL (ref 1.4–7.7)
Neutrophils Relative %: 48.4 % (ref 43.0–77.0)
Platelets: 295 10*3/uL (ref 150.0–400.0)
RBC: 4.79 Mil/uL (ref 4.22–5.81)
RDW: 14 % (ref 11.5–15.5)
WBC: 11.6 10*3/uL — ABNORMAL HIGH (ref 4.0–10.5)

## 2023-01-08 ENCOUNTER — Telehealth: Payer: Self-pay | Admitting: Family

## 2023-01-08 DIAGNOSIS — E119 Type 2 diabetes mellitus without complications: Secondary | ICD-10-CM

## 2023-01-08 MED ORDER — EMPAGLIFLOZIN 10 MG PO TABS: 10.0000 mg | ORAL_TABLET | Freq: Every day | ORAL | 2 refills | Status: DC

## 2023-01-08 NOTE — Telephone Encounter (Signed)
Please mail the below to pt regarding jardiance   Stop glipizide.  Please call me blood sugars are in the 200s.  We discussed starting a new medication called Jardiance today which protects her kidneys, prevents against cardiovascular disease and lowers your hemoglobin A1c.  It is very important in this medication that you read the below.    You should stop taking Jardiance ( SGLT2 inhibitor) and seek medical attention immediately if you have any symptoms of ketoacidosis, a serious condition in which the body produces high levels of blood acids called ketones.    Symptoms of ketoacidosis include nausea, vomiting, abdominal pain, tiredness, and trouble breathing.    You should also be alert for signs and symptoms of a urinary tract infection, such as a feeling of burning when urinating or the need to urinate often or right away; pain in the lower part of the stomach area or pelvis; fever; or blood in the urine.   Lastly, if you develop vomiting or diarrhea, such as with gastroenteritis, you should also hold Jardiance and call the office immediately.  As any volume depletion on this medication can cause ketoacidosis.  Please hold Jardiance and contact me immediately if you are experiencing any of these symptoms above.  Empagliflozin Oral Tablets What is this medication? EMPAGLIFLOZIN (EM pa gli FLOE zin) helps to treat type 2 diabetes. It helps to control blood sugar. Treatment is combined with diet and exercise. This drug may also reduce the risk of heart attack, stroke, or death if you have type 2 diabetes and risk factors for heart disease. It also treats heart failure. Itmay lower the risk for treatment of heart failure in the hospital. This medicine may be used for other purposes; ask your health care provider orpharmacist if you have questions. COMMON BRAND NAME(S): Jardiance What should I tell my care team before I take this medication? They need to know if you have any of these  conditions: dehydration diabetic ketoacidosis diet low in salt eating less due to illness, surgery, dieting, or any other reason having surgery high cholesterol high levels of potassium in the blood history of pancreatitis or pancreas problems history of yeast infection of the penis or vagina if you often drink alcohol infections in the bladder, kidneys, or urinary tract kidney disease liver disease low blood pressure on hemodialysis problems urinating type 1 diabetes uncircumcised male an unusual or allergic reaction to empagliflozin, other medicines, foods, dyes, or preservatives pregnant or trying to get pregnant breast-feeding How should I use this medication? Take this medicine by mouth with water. Take it as directed on the prescription label at the same time every day. You may take it with or without food. Keeptaking it unless your health care provider tells you to stop. A special MedGuide will be given to you by the pharmacist with eachprescription and refill. Be sure to read this information carefully each time. Talk to your health care provider about the use of this medicine in children.Special care may be needed. Overdosage: If you think you have taken too much of this medicine contact apoison control center or emergency room at once. NOTE: This medicine is only for you. Do not share this medicine with others. What if I miss a dose? If you miss a dose, take it as soon as you can. If it is almost time for yournext dose, take only that dose. Do not take double or extra doses. What may interact with this medication? alcohol diuretics insulin This list may not  describe all possible interactions. Give your health care provider a list of all the medicines, herbs, non-prescription drugs, or dietary supplements you use. Also tell them if you smoke, drink alcohol, or use illegaldrugs. Some items may interact with your medicine. What should I watch for while using this  medication? Visit your health care provider for regular checks on your progress. Tell your health care provider if your symptoms do not start to get better or if they getworse. This medicine can cause a serious condition in which there is too much acid in the blood. If you develop nausea, vomiting, stomach pain, unusual tiredness, or breathing problems, stop taking this medicine and call your doctor right away.If possible, use a ketone dipstick to check for ketones in your urine. Check with your health care provider if you have severe diarrhea, nausea, and vomiting, or if you sweat a lot. The loss of too much body fluid may make itdangerous for you to take this medicine. A test called the HbA1C (A1C) will be monitored. This is a simple blood test. It measures your blood sugar control over the last 2 to 3 months. You willreceive this test every 3 to 6 months. Learn how to check your blood sugar. Learn the symptoms of low and high bloodsugar and how to manage them. Always carry a quick-source of sugar with you in case you have symptoms of low blood sugar. Examples include hard sugar candy or glucose tablets. Make sure others know that you can choke if you eat or drink when you develop serious symptoms of low blood sugar, such as seizures or unconsciousness. Get medicalhelp at once. Tell your health care provider if you have high blood sugar. You might need to change the dose of your medicine. If you are sick or exercising more thanusual, you may need to change the dose of your medicine. What side effects may I notice from receiving this medication? Side effects that you should report to your doctor or health care professionalas soon as possible: allergic reactions (skin rash, itching or hives, swelling of the face, lips, or tongue) breathing problems dizziness feeling faint or lightheaded, falls genital infection (fever; tenderness, redness, or swelling in the genitals or area from the genitals to the  back of the rectum) kidney injury (trouble passing urine or change in the amount of urine) low blood sugar (feeling anxious; confusion; dizziness; increased hunger; unusually weak or tired; increased sweating; shakiness; cold, clammy skin; irritable; headache; blurred vision; fast heartbeat; loss of consciousness) muscle weakness nausea, vomiting, unusual stomach upset or pain new pain or tenderness, change in skin color, sores or ulcers, or infection in legs or feet penile discharge, itching, or pain unusual tiredness unusual vaginal discharge, itching, or odor urinary tract infection (fever; chills; a burning feeling when urinating; urgent need to urinate more often; blood in the urine; back pain) Side effects that usually do not require medical attention (report to yourdoctor or health care professional if they continue or are bothersome): mild increase in urination thirsty This list may not describe all possible side effects. Call your doctor for medical advice about side effects. You may report side effects to FDA at1-800-FDA-1088. Where should I keep my medication? Keep out of the reach of children and pets. Store at room temperature between 20 and 25 degrees C (68 and 77 degrees F).Get rid of any unused medicine after the expiration date. To get rid of medicines that are no longer needed or have expired: Take the medicine to a  medicine take-back program. Check with your pharmacy or law enforcement to find a location. If you cannot return the medicine, check the label or package insert to see if the medicine should be thrown out in the garbage or flushed down the toilet. If you are not sure, ask your health care provider. If it is safe to put it in the trash, take the medicine out of the container. Mix the medicine with cat litter, dirt, coffee grounds, or other unwanted substance. Seal the mixture in a bag or container. Put it in the trash. NOTE: This sheet is a summary. It may not cover  all possible information. If you have questions about this medicine, talk to your doctor, pharmacist, orhealth care provider.  2022 Elsevier/Gold Standard (2020-05-19 19:51:17)

## 2023-01-08 NOTE — Telephone Encounter (Signed)
Spoke with pt Expressed concern regarding eosinophilia, worsened from prior  FBG 170-180 Compliant with glipizide 5mg  BID.    Plan Stop glipizide  Start jardiance 10mg  for better glycemic control, renal protection; counseled on side effects Asked him to call me if FBG > 200.   He understands to call  hematology to sch appt regarding eosinophilia.

## 2023-01-08 NOTE — Telephone Encounter (Signed)
Spoke to pt and he stated  that he would come in to pick up info. Placed in envelope up front for pt pickup

## 2023-01-09 ENCOUNTER — Inpatient Hospital Stay: Payer: 59 | Attending: Oncology | Admitting: Oncology

## 2023-01-09 ENCOUNTER — Encounter: Payer: Self-pay | Admitting: Oncology

## 2023-01-09 ENCOUNTER — Inpatient Hospital Stay: Payer: 59

## 2023-01-09 ENCOUNTER — Ambulatory Visit
Admission: RE | Admit: 2023-01-09 | Discharge: 2023-01-09 | Disposition: A | Discharge status: Home / Self Care | Payer: 59 | Source: Ambulatory Visit | Attending: Oncology | Admitting: Oncology

## 2023-01-09 VITALS — BP 141/66 | HR 93 | Temp 96.0°F | Resp 18 | Wt 219.0 lb

## 2023-01-09 DIAGNOSIS — D721 Eosinophilia, unspecified: Secondary | ICD-10-CM

## 2023-01-09 DIAGNOSIS — Z7984 Long term (current) use of oral hypoglycemic drugs: Secondary | ICD-10-CM | POA: Diagnosis not present

## 2023-01-09 DIAGNOSIS — Z7982 Long term (current) use of aspirin: Secondary | ICD-10-CM | POA: Diagnosis not present

## 2023-01-09 DIAGNOSIS — Z7951 Long term (current) use of inhaled steroids: Secondary | ICD-10-CM | POA: Diagnosis not present

## 2023-01-09 DIAGNOSIS — E785 Hyperlipidemia, unspecified: Secondary | ICD-10-CM | POA: Insufficient documentation

## 2023-01-09 DIAGNOSIS — Z79899 Other long term (current) drug therapy: Secondary | ICD-10-CM | POA: Insufficient documentation

## 2023-01-09 DIAGNOSIS — I10 Essential (primary) hypertension: Secondary | ICD-10-CM | POA: Diagnosis not present

## 2023-01-09 DIAGNOSIS — Z87891 Personal history of nicotine dependence: Secondary | ICD-10-CM | POA: Insufficient documentation

## 2023-01-09 DIAGNOSIS — Z7289 Other problems related to lifestyle: Secondary | ICD-10-CM

## 2023-01-09 LAB — CBC WITH DIFFERENTIAL/PLATELET
Abs Immature Granulocytes: 0.07 10*3/uL (ref 0.00–0.07)
Basophils Absolute: 0.1 10*3/uL (ref 0.0–0.1)
Basophils Relative: 1 %
Eosinophils Absolute: 1.7 10*3/uL — ABNORMAL HIGH (ref 0.0–0.5)
Eosinophils Relative: 17 %
HCT: 43.5 % (ref 39.0–52.0)
Hemoglobin: 14.6 g/dL (ref 13.0–17.0)
Immature Granulocytes: 1 %
Lymphocytes Relative: 25 %
Lymphs Abs: 2.4 10*3/uL (ref 0.7–4.0)
MCH: 28.9 pg (ref 26.0–34.0)
MCHC: 33.6 g/dL (ref 30.0–36.0)
MCV: 86.1 fL (ref 80.0–100.0)
Monocytes Absolute: 0.6 10*3/uL (ref 0.1–1.0)
Monocytes Relative: 6 %
Neutro Abs: 5 10*3/uL (ref 1.7–7.7)
Neutrophils Relative %: 50 %
Platelets: 298 10*3/uL (ref 150–400)
RBC: 5.05 MIL/uL (ref 4.22–5.81)
RDW: 13.3 % (ref 11.5–15.5)
WBC: 9.8 10*3/uL (ref 4.0–10.5)
nRBC: 0 % (ref 0.0–0.2)

## 2023-01-09 LAB — LACTATE DEHYDROGENASE: LDH: 138 U/L (ref 98–192)

## 2023-01-09 LAB — CMP (CANCER CENTER ONLY)
ALT: 33 U/L (ref 0–44)
AST: 30 U/L (ref 15–41)
Albumin: 4.3 g/dL (ref 3.5–5.0)
Alkaline Phosphatase: 74 U/L (ref 38–126)
Anion gap: 13 (ref 5–15)
BUN: 22 mg/dL (ref 8–23)
CO2: 23 mmol/L (ref 22–32)
Calcium: 9.1 mg/dL (ref 8.9–10.3)
Chloride: 98 mmol/L (ref 98–111)
Creatinine: 0.74 mg/dL (ref 0.61–1.24)
GFR, Estimated: >60 mL/min (ref >60)
Glucose, Bld: 321 mg/dL — ABNORMAL HIGH (ref 70–99)
Potassium: 4.1 mmol/L (ref 3.5–5.1)
Sodium: 134 mmol/L — ABNORMAL LOW (ref 135–145)
Total Bilirubin: 1 mg/dL (ref 0.3–1.2)
Total Protein: 8 g/dL (ref 6.5–8.1)

## 2023-01-09 LAB — TROPONIN I (HIGH SENSITIVITY): Troponin I (High Sensitivity): 6 ng/L (ref <18)

## 2023-01-09 LAB — VITAMIN B12: Vitamin B-12: 244 pg/mL (ref 180–914)

## 2023-01-09 NOTE — Assessment & Plan Note (Signed)
Recommend cessation. ?

## 2023-01-09 NOTE — Assessment & Plan Note (Signed)
Labs reviewed and discussed with patient that Leukocytosis, predominantly eosinophilia can be secondary to infection, chronic inflammation, smoking/vaping, allergy, parasite infection, or underlying bone marrow proliferative disorders.  Vaping and his chronic allergy may attribute to it.  For the work up of patient's leukocytosis, I recommend checking CBC;B12, Tryptase, LDH, smear review, MPN panel, Jak 2 mutation, BCR-ABL FISH

## 2023-01-09 NOTE — Progress Notes (Signed)
Hematology/Oncology Consult note [phone] [phone]        REFERRING PROVIDER: Allegra Grana, FNP   CHIEF COMPLAINTS/REASON FOR VISIT:  Evaluation of    ASSESSMENT & PLAN:   Eosinophilia Labs reviewed and discussed with patient that Leukocytosis, predominantly eosinophilia can be secondary to infection, chronic inflammation, smoking/vaping, allergy, parasite infection, or underlying bone marrow proliferative disorders.  Vaping and his chronic allergy may attribute to it.  For the work up of patient's leukocytosis, I recommend checking CBC;B12, Tryptase, LDH, smear review, MPN panel, Jak 2 mutation, BCR-ABL FISH   Current every day vaping Recommend cessation.    Orders Placed This Encounter  Procedures   DG Chest 2 View    Standing Status:   Future    Number of Occurrences:   1    Standing Expiration Date:   01/09/2024    Order Specific Question:   Reason for Exam (SYMPTOM  OR DIAGNOSIS REQUIRED)    Answer:   eosinophilia    Order Specific Question:   Preferred imaging location?    Answer:   [facility]   CBC with Differential/Platelet    Standing Status:   Future    Number of Occurrences:   1    Standing Expiration Date:   01/09/2024   Vitamin B12    Standing Status:   Future    Number of Occurrences:   1    Standing Expiration Date:   01/09/2024   Tryptase    Standing Status:   Future    Number of Occurrences:   1    Standing Expiration Date:   01/09/2024   MPN w/Hypereosinophilia FISH    Standing Status:   Future    Number of Occurrences:   1    Standing Expiration Date:   01/09/2024   JAK2 [id number] rfx CALR/MPL/E12-15    Standing Status:   Future    Number of Occurrences:   1    Standing Expiration Date:   01/09/2024   BCR-ABL1 FISH    Standing Status:   Future    Number of Occurrences:   1    Standing Expiration Date:   01/09/2024   Technologist smear review    Standing Status:   Future    Standing Expiration Date:    01/09/2024    Order Specific Question:   Clinical information:    Answer:   eosinophilia   CMP (Cancer Center only)    Standing Status:   Future    Number of Occurrences:   1    Standing Expiration Date:   01/09/2024   Lactate dehydrogenase    Standing Status:   Future    Number of Occurrences:   1    Standing Expiration Date:   01/09/2024   Follow up in 3 months.  All questions were answered. The patient knows to call the clinic with any problems, questions or concerns.  Rickard Patience, MD, PhD [facility] Hematology Oncology 01/09/2023   HISTORY OF PRESENTING ILLNESS:   Thomas Brandt is a  62 y.o.  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  Denies weight loss, fever, chills, fatigue, night sweats.   - Patient reports vaping for "a year and a half" and expresses willingness to cease vaping to observe if eosinophil levels decrease. - Describes chronic sinus  issues, stating "This year has been worse than others." - Acknowledges constant allergies but is unaware of specific allergens and has not consulted an allergist. - Mentions occasional hemorrhoids, inquiring if they could affect eosinophil levels, but clinician indicates this is unlikely. 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 Denies occupational toxin exposure.   MEDICAL HISTORY:  Past Medical History:  Diagnosis Date   Bell's palsy    62 years old   Hyperlipidemia    Hypertension    Stroke 2018    SURGICAL HISTORY: History reviewed. No pertinent surgical history.  SOCIAL HISTORY: Social History   Socioeconomic History   Marital status: Single    Spouse name: Not on file   Number of children: Not on file   Years of education: Not on file   Highest education level: Not on file  Occupational History   Not on file  Tobacco Use   Smoking status: Former     Packs/day: 0.75    Years: 39.00    Additional pack years: 0.00    Total pack years: 29.25    Types: Cigarettes, E-cigarettes    Start date: 02/29/1980    Quit date: 04/30/2020    Years since quitting: 2.6   Smokeless tobacco: Never   Tobacco comments:    3-4 cigarettes a day currently  Vaping Use   Vaping Use: Some days  Substance and Sexual Activity   Alcohol use: Never   Drug use: Never   Sexual activity: Yes  Other Topics Concern   Not on file  Social History Narrative   Married   441 son- 62 year old.    2 grandkids.    Mechanic.    Social Determinants of Health   Financial Resource Strain: Not on file  Food Insecurity: No Food Insecurity (01/09/2023)   Hunger Vital Sign    Worried About Running Out of Food in the Last Year: Never true    Ran Out of Food in the Last Year: Never true  Transportation Needs: No Transportation Needs (01/09/2023)   PRAPARE - Administrator, Civil ServiceTransportation    Lack of Transportation (Medical): No    Lack of Transportation (Non-Medical): No  Physical Activity: Not on file  Stress: Not on file  Social Connections: Not on file  Intimate Partner Violence: Not At Risk (01/09/2023)   Humiliation, Afraid, Rape, and Kick questionnaire    Fear of Current or Ex-Partner: No    Emotionally Abused: No    Physically Abused: No    Sexually Abused: No    FAMILY HISTORY: Family History  Problem Relation Age of Onset   Stroke Mother    Heart disease Father    Stroke Father    Thyroid cancer Neg Hx     ALLERGIES:  is allergic to semaglutide.  MEDICATIONS:  Current Outpatient Medications  Medication Sig Dispense Refill   Accu-Chek Softclix Lancets lancets Use as instructed 100 each 12   Accu-Chek Softclix Lancets lancets Use as instructed 100 each 12   amLODipine (NORVASC) 10 MG tablet TAKE 1 TABLET BY MOUTH EVERY DAY 90 tablet 3   aspirin 81 MG chewable tablet Chew by mouth daily.     Blood Glucose Monitoring Suppl (ACCU-CHEK GUIDE) w/Device KIT Use to check  blood sugars 1 kit 2   cetirizine (ZYRTEC) 10 MG tablet TAKE 1 TABLET BY MOUTH EVERY DAY 90 tablet 3   Clotrimazole 1 % OINT Apply 1 application topically 2 (two) times daily. 56.7 g 1   gabapentin (NEURONTIN) 100 MG capsule  Take 1 capsule (100 mg total) by mouth at bedtime. 90 capsule 3   hydrochlorothiazide (MICROZIDE) 12.5 MG capsule Take 1 capsule (12.5 mg total) by mouth daily. 90 capsule 3   metFORMIN (GLUCOPHAGE) 1000 MG tablet TAKE 1 TABLET (1,000 MG TOTAL) BY MOUTH TWICE A DAY WITH FOOD 180 tablet 3   montelukast (SINGULAIR) 10 MG tablet TAKE 1 TABLET BY MOUTH EVERYDAY AT BEDTIME 90 tablet 1   nystatin-triamcinolone ointment (MYCOLOG) Apply 1 Application topically 2 (two) times daily as needed. 30 g 3   potassium chloride (KLOR-CON) 10 MEQ tablet TAKE 1 TABLET (10 MEQ TOTAL) BY MOUTH EVERY OTHER DAY. 45 tablet 2   rosuvastatin (CRESTOR) 20 MG tablet TAKE 1 TABLET BY MOUTH EVERY DAY 90 tablet 1   SYMBICORT 160-4.5 MCG/ACT inhaler TAKE 2 PUFFS BY MOUTH TWICE A DAY 30.6 each 3   telmisartan (MICARDIS) 40 MG tablet TAKE 1 TABLET BY MOUTH EVERY DAY 90 tablet 3   Tiotropium Bromide Monohydrate (SPIRIVA RESPIMAT) 2.5 MCG/ACT AERS INHALE 2 PUFFS BY MOUTH INTO THE LUNGS DAILY 4 g 3   empagliflozin (JARDIANCE) 10 MG TABS tablet Take 1 tablet (10 mg total) by mouth daily before breakfast. (Patient not taking: Reported on 01/09/2023) 30 tablet 2   No current facility-administered medications for this visit.   Facility-Administered Medications Ordered in Other Visits  Medication Dose Route Frequency Provider Last Rate Last Admin   albuterol (PROVENTIL) (2.5 MG/3ML) 0.083% nebulizer solution 2.5 mg  2.5 mg Nebulization Once Salena Saner, MD        Review of Systems  Constitutional:  Negative for appetite change, chills, fatigue, fever and unexpected weight change.  HENT:   Negative for hearing loss and voice change.   Eyes:  Negative for eye problems and icterus.  Respiratory:  Negative for  chest tightness, cough and shortness of breath.   Cardiovascular:  Negative for chest pain and leg swelling.  Gastrointestinal:  Negative for abdominal distention and abdominal pain.  Endocrine: Negative for hot flashes.  Genitourinary:  Negative for difficulty urinating, dysuria and frequency.   Musculoskeletal:  Negative for arthralgias.  Skin:  Negative for itching and rash.  Neurological:  Negative for light-headedness and numbness.  Hematological:  Negative for adenopathy. Does not bruise/bleed easily.  Psychiatric/Behavioral:  Negative for confusion.    PHYSICAL EXAMINATION: ECOG PERFORMANCE STATUS: 0 - Asymptomatic Vitals:   01/09/23 1138  BP: (!) 141/66  Pulse: 93  Resp: 18  Temp: (!) 96 F (35.6 C)   Filed Weights   01/09/23 1138  Weight: 219 lb (99.3 kg)    Physical Exam Constitutional:      General: He is not in acute distress. HENT:     Head: Normocephalic and atraumatic.  Eyes:     General: No scleral icterus. Cardiovascular:     Rate and Rhythm: Normal rate and regular rhythm.     Heart sounds: Normal heart sounds.  Pulmonary:     Effort: Pulmonary effort is normal. No respiratory distress.     Breath sounds: No wheezing.  Abdominal:     General: Bowel sounds are normal. There is no distension.     Palpations: Abdomen is soft.  Musculoskeletal:        General: No deformity. Normal range of motion.     Cervical back: Normal range of motion and neck supple.  Skin:    General: Skin is warm and dry.     Findings: No erythema or rash.  Neurological:  Mental Status: He is alert and oriented to person, place, and time. Mental status is at baseline.     Cranial Nerves: No cranial nerve deficit.     Coordination: Coordination normal.  Psychiatric:        Mood and Affect: Mood normal.     LABORATORY DATA:  I have reviewed the data as listed    Latest Ref Rng & Units 01/09/2023   12:05 PM 01/07/2023    8:46 AM 12/09/2022   10:10 AM  CBC  WBC 4.0 -  10.5 K/uL 9.8  11.6  12.5   Hemoglobin 13.0 - 17.0 g/dL 16.1  09.6  04.5   Hematocrit 39.0 - 52.0 % 43.5  41.4  41.3   Platelets 150 - 400 K/uL 298  295.0  288.0       Latest Ref Rng & Units 01/09/2023   12:06 PM 10/03/2022   11:26 AM 08/28/2022    7:54 AM  CMP  Glucose 70 - 99 mg/dL 409  811  914   BUN 8 - 23 mg/dL 22  13  22    Creatinine 0.61 - 1.24 mg/dL 7.82  9.56  2.13   Sodium 135 - 145 mmol/L 134  136  131   Potassium 3.5 - 5.1 mmol/L 4.1  4.2  4.6   Chloride 98 - 111 mmol/L 98  97  93   CO2 22 - 32 mmol/L 23  30  27    Calcium 8.9 - 10.3 mg/dL 9.1  9.0  9.0   Total Protein 6.5 - 8.1 g/dL 8.0  6.9  7.9   Total Bilirubin 0.3 - 1.2 mg/dL 1.0  0.8  1.3   Alkaline Phos 38 - 126 U/L 74  78  72   AST 15 - 41 U/L 30  25  31    ALT 0 - 44 U/L 33  26  31       RADIOGRAPHIC STUDIES: I have personally reviewed the radiological images as listed and agreed with the findings in the report. No results found.

## 2023-01-11 LAB — TRYPTASE: Tryptase: 6.1 ug/L (ref 2.2–13.2)

## 2023-01-14 LAB — BCR-ABL1 FISH
Cells Analyzed: 200
Cells Counted: 200

## 2023-01-16 LAB — MPN W/HYPEREOSINOPHILIA FISH

## 2023-01-20 LAB — CALR +MPL + E12-E15  (REFLEX)

## 2023-01-20 LAB — JAK2 V617F RFX CALR/MPL/E12-15

## 2023-01-27 ENCOUNTER — Ambulatory Visit (INDEPENDENT_AMBULATORY_CARE_PROVIDER_SITE_OTHER): Payer: 59

## 2023-01-27 DIAGNOSIS — E538 Deficiency of other specified B group vitamins: Secondary | ICD-10-CM

## 2023-01-27 MED ORDER — CYANOCOBALAMIN 1000 MCG/ML IJ SOLN
1000.0000 ug | Freq: Once | INTRAMUSCULAR | Status: AC
Start: 2023-01-27 — End: 2023-01-27
  Administered 2023-01-27: 1000 ug via INTRAMUSCULAR

## 2023-01-27 NOTE — Progress Notes (Signed)
Patient arrived for a B12 injection. Patient was administered a b12 injection into his right deltoid. Patient tolerated the injection well and did not show any signs of distress or voice any concerns. 

## 2023-02-10 ENCOUNTER — Encounter: Payer: Self-pay | Admitting: Family

## 2023-02-10 ENCOUNTER — Ambulatory Visit: Payer: 59 | Admitting: Family

## 2023-02-10 VITALS — BP 134/78 | HR 92 | Temp 97.9°F | Ht 73.0 in | Wt 219.6 lb

## 2023-02-10 DIAGNOSIS — E119 Type 2 diabetes mellitus without complications: Secondary | ICD-10-CM | POA: Diagnosis not present

## 2023-02-10 DIAGNOSIS — Z7984 Long term (current) use of oral hypoglycemic drugs: Secondary | ICD-10-CM | POA: Diagnosis not present

## 2023-02-10 MED ORDER — EMPAGLIFLOZIN 25 MG PO TABS
25.0000 mg | ORAL_TABLET | Freq: Every day | ORAL | 3 refills | Status: DC
Start: 2023-02-10 — End: 2024-03-01

## 2023-02-10 NOTE — Patient Instructions (Addendum)
Goal of fasting blood sugar is closer to 120.   Increase jardiance to 25mg   Please consider ct lung cancer and abdominal ultrasound for history of smoking.  We can also discuss these further at your physical.   Please read below.    You should stop taking Jardiance ( SGLT2 inhibitor) and seek medical attention immediately if you have any symptoms of ketoacidosis, a serious condition in which the body produces high levels of blood acids called ketones.    Symptoms of ketoacidosis include nausea, vomiting, abdominal pain, tiredness, and trouble breathing.    You should also be alert for signs and symptoms of a urinary tract infection, such as a feeling of burning when urinating or the need to urinate often or right away; pain in the lower part of the stomach area or pelvis; fever; or blood in the urine.   Lastly, if you develop vomiting or diarrhea, such as with gastroenteritis, you should also hold Jardiance and call the office immediately.  As any volume depletion on this medication can cause ketoacidosis.  Please hold Jardiance and contact me immediately if you are experiencing any of these symptoms above.  Empagliflozin Oral Tablets What is this medication? EMPAGLIFLOZIN (EM pa gli FLOE zin) helps to treat type 2 diabetes. It helps to control blood sugar. Treatment is combined with diet and exercise. This drug may also reduce the risk of heart attack, stroke, or death if you have type 2 diabetes and risk factors for heart disease. It also treats heart failure. Itmay lower the risk for treatment of heart failure in the hospital. This medicine may be used for other purposes; ask your health care provider orpharmacist if you have questions. COMMON BRAND NAME(S): Jardiance What should I tell my care team before I take this medication? They need to know if you have any of these conditions: dehydration diabetic ketoacidosis diet low in salt eating less due to illness, surgery, dieting, or  any other reason having surgery high cholesterol high levels of potassium in the blood history of pancreatitis or pancreas problems history of yeast infection of the penis or vagina if you often drink alcohol infections in the bladder, kidneys, or urinary tract kidney disease liver disease low blood pressure on hemodialysis problems urinating type 1 diabetes uncircumcised male an unusual or allergic reaction to empagliflozin, other medicines, foods, dyes, or preservatives pregnant or trying to get pregnant breast-feeding How should I use this medication? Take this medicine by mouth with water. Take it as directed on the prescription label at the same time every day. You may take it with or without food. Keeptaking it unless your health care provider tells you to stop. A special MedGuide will be given to you by the pharmacist with eachprescription and refill. Be sure to read this information carefully each time. Talk to your health care provider about the use of this medicine in children.Special care may be needed. Overdosage: If you think you have taken too much of this medicine contact apoison control center or emergency room at once. NOTE: This medicine is only for you. Do not share this medicine with others. What if I miss a dose? If you miss a dose, take it as soon as you can. If it is almost time for yournext dose, take only that dose. Do not take double or extra doses. What may interact with this medication? alcohol diuretics insulin This list may not describe all possible interactions. Give your health care provider a list of all the medicines,  herbs, non-prescription drugs, or dietary supplements you use. Also tell them if you smoke, drink alcohol, or use illegaldrugs. Some items may interact with your medicine. What should I watch for while using this medication? Visit your health care provider for regular checks on your progress. Tell your health care provider if your  symptoms do not start to get better or if they getworse. This medicine can cause a serious condition in which there is too much acid in the blood. If you develop nausea, vomiting, stomach pain, unusual tiredness, or breathing problems, stop taking this medicine and call your doctor right away.If possible, use a ketone dipstick to check for ketones in your urine. Check with your health care provider if you have severe diarrhea, nausea, and vomiting, or if you sweat a lot. The loss of too much body fluid may make itdangerous for you to take this medicine. A test called the HbA1C (A1C) will be monitored. This is a simple blood test. It measures your blood sugar control over the last 2 to 3 months. You willreceive this test every 3 to 6 months. Learn how to check your blood sugar. Learn the symptoms of low and high bloodsugar and how to manage them. Always carry a quick-source of sugar with you in case you have symptoms of low blood sugar. Examples include hard sugar candy or glucose tablets. Make sure others know that you can choke if you eat or drink when you develop serious symptoms of low blood sugar, such as seizures or unconsciousness. Get medicalhelp at once. Tell your health care provider if you have high blood sugar. You might need to change the dose of your medicine. If you are sick or exercising more thanusual, you may need to change the dose of your medicine. What side effects may I notice from receiving this medication? Side effects that you should report to your doctor or health care professionalas soon as possible: allergic reactions (skin rash, itching or hives, swelling of the face, lips, or tongue) breathing problems dizziness feeling faint or lightheaded, falls genital infection (fever; tenderness, redness, or swelling in the genitals or area from the genitals to the back of the rectum) kidney injury (trouble passing urine or change in the amount of urine) low blood sugar (feeling  anxious; confusion; dizziness; increased hunger; unusually weak or tired; increased sweating; shakiness; cold, clammy skin; irritable; headache; blurred vision; fast heartbeat; loss of consciousness) muscle weakness nausea, vomiting, unusual stomach upset or pain new pain or tenderness, change in skin color, sores or ulcers, or infection in legs or feet penile discharge, itching, or pain unusual tiredness unusual vaginal discharge, itching, or odor urinary tract infection (fever; chills; a burning feeling when urinating; urgent need to urinate more often; blood in the urine; back pain) Side effects that usually do not require medical attention (report to yourdoctor or health care professional if they continue or are bothersome): mild increase in urination thirsty This list may not describe all possible side effects. Call your doctor for medical advice about side effects. You may report side effects to FDA at1-800-FDA-1088. Where should I keep my medication? Keep out of the reach of children and pets. Store at room temperature between 20 and 25 degrees C (68 and 77 degrees F).Get rid of any unused medicine after the expiration date. To get rid of medicines that are no longer needed or have expired: Take the medicine to a medicine take-back program. Check with your pharmacy or law enforcement to find a location. If  you cannot return the medicine, check the label or package insert to see if the medicine should be thrown out in the garbage or flushed down the toilet. If you are not sure, ask your health care provider. If it is safe to put it in the trash, take the medicine out of the container. Mix the medicine with cat litter, dirt, coffee grounds, or other unwanted substance. Seal the mixture in a bag or container. Put it in the trash. NOTE: This sheet is a summary. It may not cover all possible information. If you have questions about this medicine, talk to your doctor, pharmacist, orhealth care  provider.  2022 Elsevier/Gold Standard (2020-05-19 19:51:17)

## 2023-02-10 NOTE — Progress Notes (Unsigned)
Assessment & Plan:  Type 2 diabetes mellitus without complication, without long-term current use of insulin (HCC) Assessment & Plan: Blood sugars appear to be improving however  fasting glucoses are not averaging less than 120.  Increase Jardiance 25 mg daily.Continue metformin 1000 mg twice daily.  Counseled again on side effects of Jardiance, specifically risk of dehydration and urinary tract infection  Orders: -     Empagliflozin; Take 1 tablet (25 mg total) by mouth daily before breakfast.  Dispense: 90 tablet; Refill: 3     Return precautions given.   Risks, benefits, and alternatives of the medications and treatment plan prescribed today were discussed, and patient expressed understanding.   Education regarding symptom management and diagnosis given to patient on AVS either electronically or printed.  Return in about 6 weeks (around 03/24/2023) for Complete Physical Exam.  Rennie Plowman, FNP  Subjective:    Patient ID: Thomas Brandt, male    DOB: [birth year], 62 y.o.   MRN: [id number]  CC: Thomas Brandt is a 62 y.o. male who presents today for follow up.   HPI: He feels well today and blood sugars are improving  fasting blood glucose 173, 194, 161, 173, 204, 202, 200, 170, 159, 109.   He is no longer on glipizide.  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  Follow-up Dr. Cathie Hoops July of this year   allergies: Semaglutide Current Outpatient Medications on File Prior to Visit  Medication Sig Dispense Refill   Accu-Chek Softclix Lancets lancets Use as instructed 100 each 12   Accu-Chek Softclix Lancets lancets Use as instructed 100 each 12   amLODipine (NORVASC) 10 MG tablet TAKE 1 TABLET BY MOUTH EVERY DAY 90 tablet 3   aspirin 81 MG chewable tablet Chew by mouth daily.     Blood Glucose Monitoring Suppl (ACCU-CHEK GUIDE) w/Device KIT Use to check blood sugars 1 kit 2   cetirizine (ZYRTEC) 10  MG tablet TAKE 1 TABLET BY MOUTH EVERY DAY 90 tablet 3   Clotrimazole 1 % OINT Apply 1 application topically 2 (two) times daily. 56.7 g 1   gabapentin (NEURONTIN) 100 MG capsule Take 1 capsule (100 mg total) by mouth at bedtime. 90 capsule 3   hydrochlorothiazide (MICROZIDE) 12.5 MG capsule Take 1 capsule (12.5 mg total) by mouth daily. 90 capsule 3   metFORMIN (GLUCOPHAGE) 1000 MG tablet TAKE 1 TABLET (1,000 MG TOTAL) BY MOUTH TWICE A DAY WITH FOOD 180 tablet 3   montelukast (SINGULAIR) 10 MG tablet TAKE 1 TABLET BY MOUTH EVERYDAY AT BEDTIME 90 tablet 1   nystatin-triamcinolone ointment (MYCOLOG) Apply 1 Application topically 2 (two) times daily as needed. 30 g 3   potassium chloride (KLOR-CON) 10 MEQ tablet TAKE 1 TABLET (10 MEQ TOTAL) BY MOUTH EVERY OTHER DAY. 45 tablet 2   rosuvastatin (CRESTOR) 20 MG tablet TAKE 1 TABLET BY MOUTH EVERY DAY 90 tablet 1   SYMBICORT 160-4.5 MCG/ACT inhaler TAKE 2 PUFFS BY MOUTH TWICE A DAY 30.6 each 3   telmisartan (MICARDIS) 40 MG tablet TAKE 1 TABLET BY MOUTH EVERY DAY 90 tablet 3   Tiotropium Bromide Monohydrate (SPIRIVA RESPIMAT) 2.5 MCG/ACT AERS INHALE 2 PUFFS BY MOUTH INTO THE LUNGS DAILY 4 g 3   Current Facility-Administered Medications on File Prior to Visit  Medication Dose Route Frequency Provider Last Rate Last Admin   albuterol (PROVENTIL) (2.5 MG/3ML) 0.083% nebulizer solution 2.5 mg  2.5 mg Nebulization Once  Salena Saner, MD        Review of Systems  Constitutional:  Negative for chills and fever.  Respiratory:  Negative for cough.   Cardiovascular:  Negative for chest pain and palpitations.  Gastrointestinal:  Negative for nausea and vomiting.      Objective:    BP 134/78   Pulse 92   Temp 97.9 F (36.6 C) (Oral)   Ht 6\' 1"  (1.854 m)   Wt 219 lb 9.6 oz (99.6 kg)   SpO2 95%   BMI 28.97 kg/m  BP Readings from Last 3 Encounters:  02/10/23 134/78  01/09/23 (!) 141/66  11/26/22 138/82   Wt Readings from Last 3 Encounters:   02/10/23 219 lb 9.6 oz (99.6 kg)  01/09/23 219 lb (99.3 kg)  11/26/22 213 lb (96.6 kg)    Physical Exam Vitals reviewed.  Constitutional:      Appearance: He is well-developed.  Cardiovascular:     Rate and Rhythm: Regular rhythm.     Heart sounds: Normal heart sounds.  Pulmonary:     Effort: Pulmonary effort is normal. No respiratory distress.     Breath sounds: Normal breath sounds. No wheezing, rhonchi or rales.  Skin:    General: Skin is warm and dry.  Neurological:     Mental Status: He is alert.  Psychiatric:        Speech: Speech normal.        Behavior: Behavior normal.

## 2023-02-11 NOTE — Assessment & Plan Note (Addendum)
Blood sugars appear to be improving however  fasting glucoses are not averaging less than 120.  Increase Jardiance 25 mg daily.Continue metformin 1000 mg twice daily.  Counseled again on side effects of Jardiance, specifically risk of dehydration and urinary tract infection

## 2023-02-27 ENCOUNTER — Ambulatory Visit (INDEPENDENT_AMBULATORY_CARE_PROVIDER_SITE_OTHER): Payer: 59

## 2023-02-27 DIAGNOSIS — E538 Deficiency of other specified B group vitamins: Secondary | ICD-10-CM

## 2023-02-27 MED ORDER — CYANOCOBALAMIN 1000 MCG/ML IJ SOLN
1000.0000 ug | Freq: Once | INTRAMUSCULAR | Status: AC
Start: 2023-02-27 — End: 2023-02-27
  Administered 2023-02-27: 1000 ug via INTRAMUSCULAR

## 2023-02-27 NOTE — Progress Notes (Signed)
Patient arrived for a B12 injection and it was administered into his left deltoid. Patient tolerated the injection well and did not show any signs of distress or voice any concerns. 

## 2023-02-28 ENCOUNTER — Ambulatory Visit: Payer: 59 | Admitting: Family

## 2023-03-03 ENCOUNTER — Ambulatory Visit: Payer: 59

## 2023-03-31 ENCOUNTER — Ambulatory Visit: Payer: 59

## 2023-03-31 ENCOUNTER — Ambulatory Visit (INDEPENDENT_AMBULATORY_CARE_PROVIDER_SITE_OTHER): Payer: 59

## 2023-03-31 DIAGNOSIS — E538 Deficiency of other specified B group vitamins: Secondary | ICD-10-CM | POA: Diagnosis not present

## 2023-03-31 MED ORDER — CYANOCOBALAMIN 1000 MCG/ML IJ SOLN
1000.0000 ug | Freq: Once | INTRAMUSCULAR | Status: AC
Start: 2023-03-31 — End: 2023-03-31
  Administered 2023-03-31: 1000 ug via INTRAMUSCULAR

## 2023-03-31 NOTE — Progress Notes (Signed)
Pt presented for their vitamin B12 injection. Pt was identified through two identifiers. Pt tolerated shot well in their ight deltoid.  

## 2023-04-07 ENCOUNTER — Telehealth: Payer: Self-pay | Admitting: Family

## 2023-04-07 DIAGNOSIS — R7309 Other abnormal glucose: Secondary | ICD-10-CM

## 2023-04-07 NOTE — Telephone Encounter (Signed)
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

## 2023-04-09 NOTE — Telephone Encounter (Signed)
ORDERED AND SCHEDULED PT IS AWARE

## 2023-04-09 NOTE — Telephone Encounter (Signed)
Please order and sch A1c

## 2023-04-09 NOTE — Telephone Encounter (Signed)
Patient called and stated no has called him about having labs done.

## 2023-04-10 ENCOUNTER — Other Ambulatory Visit: Payer: 59

## 2023-04-10 ENCOUNTER — Ambulatory Visit: Payer: 59 | Admitting: Oncology

## 2023-04-15 ENCOUNTER — Other Ambulatory Visit (INDEPENDENT_AMBULATORY_CARE_PROVIDER_SITE_OTHER): Payer: 59

## 2023-04-15 DIAGNOSIS — R7309 Other abnormal glucose: Secondary | ICD-10-CM

## 2023-04-15 LAB — HEMOGLOBIN A1C: Hgb A1c MFr Bld: 8.3 % — ABNORMAL HIGH (ref 4.6–6.5)

## 2023-04-20 ENCOUNTER — Other Ambulatory Visit: Payer: Self-pay | Admitting: Family

## 2023-04-23 ENCOUNTER — Encounter: Payer: Self-pay | Admitting: Family

## 2023-04-23 ENCOUNTER — Ambulatory Visit: Payer: 59 | Admitting: Family

## 2023-04-23 VITALS — BP 124/72 | HR 87 | Temp 97.8°F | Ht 73.0 in | Wt 213.6 lb

## 2023-04-23 DIAGNOSIS — Z7985 Long-term (current) use of injectable non-insulin antidiabetic drugs: Secondary | ICD-10-CM | POA: Diagnosis not present

## 2023-04-23 DIAGNOSIS — Z7984 Long term (current) use of oral hypoglycemic drugs: Secondary | ICD-10-CM

## 2023-04-23 DIAGNOSIS — E119 Type 2 diabetes mellitus without complications: Secondary | ICD-10-CM

## 2023-04-23 MED ORDER — SEMAGLUTIDE(0.25 OR 0.5MG/DOS) 2 MG/3ML ~~LOC~~ SOPN
0.2500 mg | PEN_INJECTOR | SUBCUTANEOUS | 2 refills | Status: DC
Start: 2023-04-23 — End: 2023-07-14

## 2023-04-23 MED ORDER — GLIPIZIDE 5 MG PO TABS
5.0000 mg | ORAL_TABLET | Freq: Every day | ORAL | Status: DC
Start: 2023-04-23 — End: 2023-07-30

## 2023-04-23 NOTE — Patient Instructions (Addendum)
Start ozempic once per week Decrease glipizide to 5mg  once daily WITH BREAKFAST to avoid hypoglycemia since starting ozempic

## 2023-04-23 NOTE — Assessment & Plan Note (Addendum)
Lab Results  Component Value Date   HGBA1C 8.3 (H) 04/15/2023   Continue Jardiance 25 mg daily,  metformin 1000 mg twice daily. Decrease glipizide  from 5mg  BID to glipizide 5mg  every day to reduce risk of hypoglycemia while we start ozempic 0.25mg .  Strict precautions of abdominal pain or constipation were to recur he will need to stop Ozempic immediately. At that point we would consider long-acting insulin.  If he tolerates Ozempic, we will work to discontinue glipizide .

## 2023-04-23 NOTE — Progress Notes (Signed)
Assessment & Plan:  Type 2 diabetes mellitus without complication, without long-term current use of insulin (HCC) Assessment & Plan: Lab Results  Component Value Date   HGBA1C 8.3 (H) 04/15/2023   Continue Jardiance 25 mg daily,  metformin 1000 mg twice daily. Decrease glipizide  from 5mg  BID to glipizide 5mg  every day to reduce risk of hypoglycemia while we start ozempic 0.25mg .  Strict precautions of abdominal pain or constipation were to recur he will need to stop Ozempic immediately. At that point we would consider long-acting insulin.  If he tolerates Ozempic, we will work to discontinue glipizide .    Orders: -     Semaglutide(0.25 or 0.5MG /DOS); Inject 0.25 mg into the skin once a week.  Dispense: 3 mL; Refill: 2 -     glipiZIDE; Take 1 tablet (5 mg total) by mouth daily before breakfast.     Return precautions given.   Risks, benefits, and alternatives of the medications and treatment plan prescribed today were discussed, and patient expressed understanding.   Education regarding symptom management and diagnosis given to patient on AVS either electronically or printed.  No follow-ups on file.  Rennie Plowman, FNP  Subjective:    Patient ID: Thomas Brandt, male    DOB: [birth year], 62 y.o.   MRN: [id number]  CC: Thomas Brandt is a 62 y.o. male who presents today for follow up.   HPI: Patient feels today.  No new concerns   No hypoglycemic episodes   compliant with Jardiance 25 mg daily, glipizide 5mg  BID,  metformin 1000 mg twice daily.   Previous on Rybelsus however caused abdominal pain.  He is interested in trying Ozempic.  He does not want to start insulin.    No personal or family history of thyroid cancer, multiple endocrine neoplasia  Allergies: Semaglutide Current Outpatient Medications on File Prior to Visit  Medication Sig Dispense Refill   Accu-Chek Softclix Lancets lancets Use as instructed 100 each 12   Accu-Chek Softclix Lancets lancets  Use as instructed 100 each 12   amLODipine (NORVASC) 10 MG tablet TAKE 1 TABLET BY MOUTH EVERY DAY 90 tablet 3   aspirin 81 MG chewable tablet Chew by mouth daily.     Blood Glucose Monitoring Suppl (ACCU-CHEK GUIDE) w/Device KIT Use to check blood sugars 1 kit 2   cetirizine (ZYRTEC) 10 MG tablet TAKE 1 TABLET BY MOUTH EVERY DAY 90 tablet 3   Clotrimazole 1 % OINT Apply 1 application topically 2 (two) times daily. 56.7 g 1   empagliflozin (JARDIANCE) 25 MG TABS tablet Take 1 tablet (25 mg total) by mouth daily before breakfast. 90 tablet 3   gabapentin (NEURONTIN) 100 MG capsule Take 1 capsule (100 mg total) by mouth at bedtime. 90 capsule 3   hydrochlorothiazide (MICROZIDE) 12.5 MG capsule TAKE 1 CAPSULE BY MOUTH EVERY DAY 90 capsule 3   metFORMIN (GLUCOPHAGE) 1000 MG tablet TAKE 1 TABLET (1,000 MG TOTAL) BY MOUTH TWICE A DAY WITH FOOD 180 tablet 3   montelukast (SINGULAIR) 10 MG tablet TAKE 1 TABLET BY MOUTH EVERYDAY AT BEDTIME 90 tablet 1   nystatin-triamcinolone ointment (MYCOLOG) Apply 1 Application topically 2 (two) times daily as needed. 30 g 3   potassium chloride (KLOR-CON) 10 MEQ tablet TAKE 1 TABLET (10 MEQ TOTAL) BY MOUTH EVERY OTHER DAY. 45 tablet 2   rosuvastatin (CRESTOR) 20 MG tablet TAKE 1 TABLET BY MOUTH EVERY DAY 90 tablet 1   SYMBICORT 160-4.5 MCG/ACT inhaler TAKE 2 PUFFS BY  MOUTH TWICE A DAY 30.6 each 3   telmisartan (MICARDIS) 40 MG tablet TAKE 1 TABLET BY MOUTH EVERY DAY 90 tablet 3   Tiotropium Bromide Monohydrate (SPIRIVA RESPIMAT) 2.5 MCG/ACT AERS INHALE 2 PUFFS BY MOUTH INTO THE LUNGS DAILY 1 each 5   Current Facility-Administered Medications on File Prior to Visit  Medication Dose Route Frequency Provider Last Rate Last Admin   albuterol (PROVENTIL) (2.5 MG/3ML) 0.083% nebulizer solution 2.5 mg  2.5 mg Nebulization Once Salena Saner, MD        Review of Systems  Constitutional:  Negative for chills and fever.  Respiratory:  Negative for cough.    Cardiovascular:  Negative for chest pain and palpitations.  Gastrointestinal:  Negative for nausea and vomiting.      Objective:    BP 124/72   Pulse 87   Temp 97.8 F (36.6 C) (Oral)   Ht 6\' 1"  (1.854 m)   Wt 213 lb 9.6 oz (96.9 kg)   BMI 28.18 kg/m  BP Readings from Last 3 Encounters:  04/23/23 124/72  02/10/23 134/78  01/09/23 (!) 141/66   Wt Readings from Last 3 Encounters:  04/23/23 213 lb 9.6 oz (96.9 kg)  02/10/23 219 lb 9.6 oz (99.6 kg)  01/09/23 219 lb (99.3 kg)    Physical Exam Vitals reviewed.  Constitutional:      Appearance: He is well-developed.  Cardiovascular:     Rate and Rhythm: Regular rhythm.     Heart sounds: Normal heart sounds.  Pulmonary:     Effort: Pulmonary effort is normal. No respiratory distress.     Breath sounds: Normal breath sounds. No wheezing, rhonchi or rales.  Skin:    General: Skin is warm and dry.  Neurological:     Mental Status: He is alert.  Psychiatric:        Speech: Speech normal.        Behavior: Behavior normal.

## 2023-05-01 ENCOUNTER — Ambulatory Visit (INDEPENDENT_AMBULATORY_CARE_PROVIDER_SITE_OTHER): Payer: 59 | Admitting: *Deleted

## 2023-05-01 DIAGNOSIS — E538 Deficiency of other specified B group vitamins: Secondary | ICD-10-CM | POA: Diagnosis not present

## 2023-05-01 MED ORDER — CYANOCOBALAMIN 1000 MCG/ML IJ SOLN
1000.0000 ug | Freq: Once | INTRAMUSCULAR | Status: AC
Start: 2023-05-01 — End: 2023-05-01
  Administered 2023-05-01: 1000 ug via INTRAMUSCULAR

## 2023-05-01 NOTE — Progress Notes (Signed)
Per orders of Wynona Dove, FNP, injection of B-12 given by Vertis Kelch in left deltoid. Patient tolerated injection well.

## 2023-05-15 ENCOUNTER — Encounter (INDEPENDENT_AMBULATORY_CARE_PROVIDER_SITE_OTHER): Payer: Self-pay

## 2023-06-03 ENCOUNTER — Ambulatory Visit (INDEPENDENT_AMBULATORY_CARE_PROVIDER_SITE_OTHER): Payer: 59

## 2023-06-03 DIAGNOSIS — E538 Deficiency of other specified B group vitamins: Secondary | ICD-10-CM | POA: Diagnosis not present

## 2023-06-03 MED ORDER — CYANOCOBALAMIN 1000 MCG/ML IJ SOLN
1000.0000 ug | Freq: Once | INTRAMUSCULAR | Status: AC
Start: 2023-06-03 — End: 2023-06-03
  Administered 2023-06-03: 1000 ug via INTRAMUSCULAR

## 2023-06-03 NOTE — Progress Notes (Signed)
Patient presented for B 12 injection to left deltoid, patient voiced no concerns nor showed any signs of distress during injection. 

## 2023-07-04 ENCOUNTER — Ambulatory Visit (INDEPENDENT_AMBULATORY_CARE_PROVIDER_SITE_OTHER): Payer: 59 | Admitting: *Deleted

## 2023-07-04 DIAGNOSIS — E538 Deficiency of other specified B group vitamins: Secondary | ICD-10-CM | POA: Diagnosis not present

## 2023-07-04 DIAGNOSIS — Z23 Encounter for immunization: Secondary | ICD-10-CM | POA: Diagnosis not present

## 2023-07-04 MED ORDER — CYANOCOBALAMIN 1000 MCG/ML IJ SOLN
1000.0000 ug | Freq: Once | INTRAMUSCULAR | Status: AC
Start: 2023-07-04 — End: 2023-07-04
  Administered 2023-07-04: 1000 ug via INTRAMUSCULAR

## 2023-07-04 NOTE — Progress Notes (Signed)
Pt received B12 injection in right deltoid muscle. Pt tolerated it well with no complaints or concerns.  PT also received regular dose flu vaccine in left deltoid & tolerated it well with no complaints.

## 2023-07-13 ENCOUNTER — Other Ambulatory Visit: Payer: Self-pay | Admitting: Family

## 2023-07-13 DIAGNOSIS — E119 Type 2 diabetes mellitus without complications: Secondary | ICD-10-CM

## 2023-07-17 ENCOUNTER — Ambulatory Visit: Payer: 59 | Admitting: Family

## 2023-07-29 ENCOUNTER — Telehealth: Payer: Self-pay | Admitting: Family

## 2023-07-29 NOTE — Telephone Encounter (Signed)
Patient just called and said he has a blood pressure monitor. And he said when he took his blood pressure it read irregular heart beat and contact his provider. Can someone call him. His number is [phone].

## 2023-07-29 NOTE — Telephone Encounter (Signed)
Spoke to pt he stated that he has new bp machine he was having an irregular heartbeat last night and again today.  I asked pt was he having any chest pain or sob. Pt stated that he felt fine. I scheduled him an appt to come in tomorrow on 10 /30/24. I also advised him that if he has any cp or sob before tomorrow that he needs to go to ED . Pt e

## 2023-07-30 ENCOUNTER — Encounter: Payer: Self-pay | Admitting: Family

## 2023-07-30 ENCOUNTER — Ambulatory Visit: Payer: 59 | Admitting: Family

## 2023-07-30 ENCOUNTER — Ambulatory Visit: Payer: 59 | Attending: Family

## 2023-07-30 VITALS — BP 142/70 | HR 90 | Temp 97.8°F | Ht 73.0 in | Wt 226.2 lb

## 2023-07-30 DIAGNOSIS — R899 Unspecified abnormal finding in specimens from other organs, systems and tissues: Secondary | ICD-10-CM | POA: Diagnosis not present

## 2023-07-30 DIAGNOSIS — Z125 Encounter for screening for malignant neoplasm of prostate: Secondary | ICD-10-CM | POA: Diagnosis not present

## 2023-07-30 DIAGNOSIS — Z7984 Long term (current) use of oral hypoglycemic drugs: Secondary | ICD-10-CM | POA: Diagnosis not present

## 2023-07-30 DIAGNOSIS — R7309 Other abnormal glucose: Secondary | ICD-10-CM

## 2023-07-30 DIAGNOSIS — I499 Cardiac arrhythmia, unspecified: Secondary | ICD-10-CM | POA: Insufficient documentation

## 2023-07-30 DIAGNOSIS — E538 Deficiency of other specified B group vitamins: Secondary | ICD-10-CM

## 2023-07-30 DIAGNOSIS — K625 Hemorrhage of anus and rectum: Secondary | ICD-10-CM | POA: Insufficient documentation

## 2023-07-30 DIAGNOSIS — E119 Type 2 diabetes mellitus without complications: Secondary | ICD-10-CM

## 2023-07-30 DIAGNOSIS — I10 Essential (primary) hypertension: Secondary | ICD-10-CM | POA: Diagnosis not present

## 2023-07-30 LAB — POCT GLYCOSYLATED HEMOGLOBIN (HGB A1C): Hemoglobin A1C: 9 % — AB (ref 4.0–5.6)

## 2023-07-30 LAB — MICROALBUMIN / CREATININE URINE RATIO
Creatinine,U: 45.4 mg/dL
Microalb Creat Ratio: 1.5 mg/g (ref 0.0–30.0)
Microalb, Ur: <0.7 mg/dL (ref 0.0–1.9)

## 2023-07-30 MED ORDER — CYANOCOBALAMIN 1000 MCG/ML IJ SOLN
1000.0000 ug | Freq: Once | INTRAMUSCULAR | Status: AC
  Administered 2023-07-30: 1000 ug via INTRAMUSCULAR

## 2023-07-30 MED ORDER — TELMISARTAN 40 MG PO TABS: 40.0000 mg | ORAL_TABLET | Freq: Every day | ORAL | 3 refills | Status: DC

## 2023-07-30 MED ORDER — TELMISARTAN 20 MG PO TABS: 20.0000 mg | ORAL_TABLET | Freq: Every day | ORAL | 3 refills | Status: DC

## 2023-07-30 MED ORDER — HYDROCORTISONE (PERIANAL) 2.5 % EX CREA: 1.0000 | TOPICAL_CREAM | Freq: Two times a day (BID) | CUTANEOUS | 1 refills | Status: AC

## 2023-07-30 MED ORDER — OZEMPIC (0.25 OR 0.5 MG/DOSE) 2 MG/3ML ~~LOC~~ SOPN
0.5000 mg | PEN_INJECTOR | SUBCUTANEOUS | 2 refills | Status: DC
Start: 2023-07-30 — End: 2023-11-18

## 2023-07-30 NOTE — Progress Notes (Unsigned)
Assessment & Plan:  Irregular heart beat -     EKG 12-Lead -     ECHOCARDIOGRAM COMPLETE; Future -     LONG TERM MONITOR (3-14 DAYS); Future -     US Carotid Bilateral; Future  Type 2 diabetes mellitus without complication, without long-term current use of insulin (HCC) Assessment & Plan: Lab Results  Component Value Date   HGBA1C 9.0 (A) 07/30/2023   Poor control.  Increase Ozempic to 0.5 mg weekly. Continue metformin 1000mg  BID, jardiance 25mg .  Orders: -     Ozempic (0.25 or 0.5 MG/DOSE); Inject 0.5 mg into the skin once a week.  Dispense: 3 mL; Refill: 2 -     Lipid panel; Future -     Microalbumin / creatinine urine ratio; Future -     CBC with Differential/Platelet; Future  Essential hypertension Assessment & Plan: Suboptimal control.  Increase telmisartan from 40 mg to 60 mg.  Continue Norvasc 10 mg daily, hydrochlorothiazide 12.5 mg.  Labs in 1 week time  Orders: -     TSH; Future -     Telmisartan; Take 1 tablet (40 mg total) by mouth daily.  Dispense: 90 tablet; Refill: 3 -     Telmisartan; Take 1 tablet (20 mg total) by mouth daily.  Dispense: 90 tablet; Refill: 3 -     Basic metabolic panel; Future  Abnormal laboratory test -     CBC with Differential/Platelet; Future -     Comprehensive metabolic panel; Future  Screening for prostate cancer -     PSA; Future  Rectal bleeding Assessment & Plan: Declines exam in office today.  Reiterated the importance of having a screening colonoscopy.  He will let me know if he were to change his mind.  I provided him with Anusol ointment.  Orders: -     Hydrocortisone (Perianal); Place 1 Application rectally 2 (two) times daily.  Dispense: 30 g; Refill: 1  Irregular heart rhythm Assessment & Plan: On exam irregular irregular heartbeat during auscultation.  Fortunately patient is asymptomatic and EKG shows sinus rhythm.  No previous EKG to compare.  History of CVA . 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 pending updating echocardiogram, ultrasound carotid, Zio monitor.  Plan to discuss cardiology consult after we have results.    Elevated glucose -     POCT glycosylated hemoglobin (Hb A1C)  B12 deficiency Assessment & Plan: B12 IM given today.  Will update B12 lab at follow up.   Orders: -     Cyanocobalamin -     B12 and Folate Panel; Future  Other orders -     LDL cholesterol, direct     Return precautions given.   Risks, benefits, and alternatives of the medications and treatment plan prescribed today were discussed, and patient expressed understanding.   Education regarding symptom management and diagnosis given to patient on AVS either electronically or printed.  Return in about 1 week (around 08/06/2023).  Rennie Plowman, FNP  Subjective:    Patient ID: Thomas Brandt, Thomas Brandt    DOB: [birth year], 62 y.o.   MRN: [id number]  CC: Thomas Brandt is a 62 y.o. Thomas Brandt who presents today for an acute visit.    HPI: Here is today after blood pressure machine alerted him that he had ' an irregular heartbeat'. There were 3 instances of this which prompted the phone call.      He feels well.   He denies associated chest pain, shortness of  breath, palpitations  He is not using caffeine.  He has just started Everstrong ( creatine, coffeeberry, boron) supplement.    Denies daily NSAID use, sudafed  History of CVA 2018  He is compliant with ASA 81mg , crestor 20mg   FBG 200  Doing well ozempic 0.25mg . Denies constipation, abdominal pain   Compliant with metformin 1000mg  BID, jardiance 25mg . He is no longer on glipizide.  She endorses dietary indiscretion with soda.   Due for b12 08/04/23; he requests early.    He can feel a hemorrhoid which is painful. He has had a hemorrhoid for years.  Improves with preparation H and aleve.  No straining or constipation.   He occasionally sees BRB blood on the toilet paper. Stool is brown. No melana.  He has never  colonoscopy.  TEE 09/25/2017 revealing mild mitral regurgitation ,congenitally malformed bicuspid aortic valve.  No evidence of intracardiac thrombus.  No apparent valvular vegetation Allergies: Semaglutide Current Outpatient Medications on File Prior to Visit  Medication Sig Dispense Refill   Accu-Chek Softclix Lancets lancets Use as instructed 100 each 12   Accu-Chek Softclix Lancets lancets Use as instructed 100 each 12   amLODipine (NORVASC) 10 MG tablet TAKE 1 TABLET BY MOUTH EVERY DAY 90 tablet 3   aspirin 81 MG chewable tablet Chew by mouth daily.     Blood Glucose Monitoring Suppl (ACCU-CHEK GUIDE) w/Device KIT Use to check blood sugars 1 kit 2   cetirizine (ZYRTEC) 10 MG tablet TAKE 1 TABLET BY MOUTH EVERY DAY 90 tablet 3   Clotrimazole 1 % OINT Apply 1 application topically 2 (two) times daily. 56.7 g 1   empagliflozin (JARDIANCE) 25 MG TABS tablet Take 1 tablet (25 mg total) by mouth daily before breakfast. 90 tablet 3   gabapentin (NEURONTIN) 100 MG capsule Take 1 capsule (100 mg total) by mouth at bedtime. 90 capsule 3   hydrochlorothiazide (MICROZIDE) 12.5 MG capsule TAKE 1 CAPSULE BY MOUTH EVERY DAY 90 capsule 3   metFORMIN (GLUCOPHAGE) 1000 MG tablet TAKE 1 TABLET (1,000 MG TOTAL) BY MOUTH TWICE A DAY WITH FOOD 180 tablet 3   montelukast (SINGULAIR) 10 MG tablet TAKE 1 TABLET BY MOUTH EVERYDAY AT BEDTIME 90 tablet 1   nystatin-triamcinolone ointment (MYCOLOG) Apply 1 Application topically 2 (two) times daily as needed. 30 g 3   potassium chloride (KLOR-CON) 10 MEQ tablet TAKE 1 TABLET (10 MEQ TOTAL) BY MOUTH EVERY OTHER DAY. 45 tablet 2   rosuvastatin (CRESTOR) 20 MG tablet TAKE 1 TABLET BY MOUTH EVERY DAY 90 tablet 1   SYMBICORT 160-4.5 MCG/ACT inhaler TAKE 2 PUFFS BY MOUTH TWICE A DAY 30.6 each 3   Tiotropium Bromide Monohydrate (SPIRIVA RESPIMAT) 2.5 MCG/ACT AERS INHALE 2 PUFFS BY MOUTH INTO THE LUNGS DAILY 1 each 5   Current Facility-Administered Medications on File Prior  to Visit  Medication Dose Route Frequency Provider Last Rate Last Admin   albuterol (PROVENTIL) (2.5 MG/3ML) 0.083% nebulizer solution 2.5 mg  2.5 mg Nebulization Once Salena Saner, MD        Review of Systems  Constitutional:  Negative for chills and fever.  Respiratory:  Negative for cough and shortness of breath.   Cardiovascular:  Negative for chest pain and palpitations.  Gastrointestinal:  Positive for anal bleeding. Negative for blood in stool, nausea and vomiting.      Objective:    BP (!) 142/70   Pulse 90   Temp 97.8 F (36.6 C) (Oral)   Ht 6\' 1"  (1.854  m)   Wt 226 lb 3.2 oz (102.6 kg)   SpO2 96%   BMI 29.84 kg/m   BP Readings from Last 3 Encounters:  07/30/23 (!) 142/70  04/23/23 124/72  02/10/23 134/78   Wt Readings from Last 3 Encounters:  07/30/23 226 lb 3.2 oz (102.6 kg)  04/23/23 213 lb 9.6 oz (96.9 kg)  02/10/23 219 lb 9.6 oz (99.6 kg)    Physical Exam Vitals reviewed.  Constitutional:      Appearance: He is well-developed.  Cardiovascular:     Rate and Rhythm: Rhythm irregularly irregular.     Heart sounds: Normal heart sounds.  Pulmonary:     Effort: Pulmonary effort is normal. No respiratory distress.     Breath sounds: Normal breath sounds. No wheezing, rhonchi or rales.  Skin:    General: Skin is warm and dry.  Neurological:     Mental Status: He is alert.  Psychiatric:        Speech: Speech normal.        Behavior: Behavior normal.

## 2023-07-30 NOTE — Patient Instructions (Addendum)
Start colace as stool softener  Provided you with anu sol as well.   Increase ozempic to 0.5mg  once weekly.   Increase telmisartan from 40mg  to 60mg  . Goal of blood pressure is < 130/80.  We will obtain labs again next week.   Please stop creatine supplement as discussed   I have ordered an ultrasound of your heart, carotid arteries  Let us know if you dont hear back within a week in regards to an appointment being scheduled.   So that you are aware, if you are [facility] MyChart user , please pay attention to your MyChart messages as you may receive a MyChart message with a phone number to call and schedule this test/appointment own your own from our referral coordinator. This is a new process so I do not want you to miss this message.  If you are not a MyChart user, you will receive a phone call.    I have ordered a 14 day Zio monitor which will mailed directly to you. The device will include instructions on how to apply.   The Zio 14 day monitor that we use DOES NOT have 24 hour live monitoring. The rhythm of your heart will not be monitored while you are wearing it. Only AFTER you mail the device back in and a cardiologist interprets the data will we know if an underlying cardiac arrhythmia is going on.   There are models that offer 24 hour live monitoring however NOT this one. I wanted to be sure that you were aware of this as certainly didn't want this to be a false sense of security.   If you experience chest pain, shortness of breath, left arm numbness, or sustained, more frequent palpitations , do not wait and call 911 right away. We are in a delicate period of work up regarding palpitations and until we are sure that you do not have an underlying arrhythmia, you must be extremely vigilant and cautious.      ZIO XT- Long Term Monitor Instructions   Your provider has requested you wear a ZIO patch monitor for 14 days.  This is a single patch monitor. [employer] supplies one patch  monitor per enrollment. Additional stickers are not available. Please do not apply patch if you will be having a Nuclear Stress Test,  Echocardiogram, Cardiac CT, MRI, or Chest Xray during the period you would be wearing the  monitor. The patch cannot be worn during these tests. You cannot remove and re-apply the  ZIO XT patch monitor.  Your ZIO patch monitor will be mailed 3 day USPS to your address on file. It may take 3-5 days  to receive your monitor after you have been enrolled.  Once you have received your monitor, please review the enclosed instructions. Your monitor  has already been registered assigning a specific monitor serial # to you.   Billing and Patient Assistance Program Information   We have supplied [employer] with any of your insurance information on file for billing purposes. [employer] offers a sliding scale Patient Assistance Program for patients that do not have  insurance, or whose insurance does not completely cover the cost of the ZIO monitor.  You must apply for the Patient Assistance Program to qualify for this discounted rate.  To apply, please call [employer] at [phone], select option 4, select option 2, ask to apply for  Patient Assistance Program. Meredeth Ide will ask your household income, and how many people  are in your household. They will quote  your out-of-pocket cost based on that information.  [employer] will also be able to set up a 85-month, interest-free payment plan if needed.   Applying the monitor   Shave hair from upper left chest.  Hold abrader disc by orange tab. Rub abrader in 40 strokes over the upper left chest as  indicated in your monitor instructions.  Clean area with 4 enclosed alcohol pads. Let dry.  Apply patch as indicated in monitor instructions. Patch will be placed under collarbone on left  side of chest with arrow pointing upward.  Rub patch adhesive wings for 2 minutes. Remove white label marked "1". Remove the white  label marked  "2". Rub patch adhesive wings for 2 additional minutes.  While looking in a mirror, press and release button in center of patch. A small green light will  flash 3-4 times. This will be your only indicator that the monitor has been turned on.  Do not shower for the first 24 hours. You may shower after the first 24 hours.  Press the button if you feel a symptom. You will hear a small click. Record Date, Time and  Symptom in the Patient Logbook.  When you are ready to remove the patch, follow instructions on the last 2 pages of Patient  Logbook. Stick patch monitor onto the last page of Patient Logbook.  Place Patient Logbook in the blue and white box. Use locking tab on box and tape box closed  securely. The blue and white box has prepaid postage on it. Please place it in the mailbox as  soon as possible. Your physician should have your test results approximately 7 days after the  monitor has been mailed back to [facility].  Call [facility] Customer Care at [phone] if you have questions regarding  your ZIO XT patch monitor. Call them immediately if you see an orange light blinking on your  monitor.  If your monitor falls off in less than 4 days, contact our Monitor department at [phone].  If your monitor becomes loose or falls off after 4 days call [employer] at [phone] for  suggestions on securing your monitor

## 2023-07-31 LAB — LIPID PANEL
Cholesterol: 158 mg/dL (ref 0–200)
HDL: 42.1 mg/dL (ref >39.00)
Total CHOL/HDL Ratio: 4
Triglycerides: 493 mg/dL — ABNORMAL HIGH (ref 0.0–149.0)

## 2023-07-31 LAB — CBC WITH DIFFERENTIAL/PLATELET
Basophils Absolute: 0.2 10*3/uL — ABNORMAL HIGH (ref 0.0–0.1)
Basophils Relative: 1.7 % (ref 0.0–3.0)
Eosinophils Absolute: 1 10*3/uL — ABNORMAL HIGH (ref 0.0–0.7)
Eosinophils Relative: 9.2 % — ABNORMAL HIGH (ref 0.0–5.0)
HCT: 41.4 % (ref 39.0–52.0)
Hemoglobin: 13.5 g/dL (ref 13.0–17.0)
Lymphocytes Relative: 26 % (ref 12.0–46.0)
Lymphs Abs: 2.9 10*3/uL (ref 0.7–4.0)
MCHC: 32.6 g/dL (ref 30.0–36.0)
MCV: 86.7 fL (ref 78.0–100.0)
Monocytes Absolute: 0.9 10*3/uL (ref 0.1–1.0)
Monocytes Relative: 7.9 % (ref 3.0–12.0)
Neutro Abs: 6.1 10*3/uL (ref 1.4–7.7)
Neutrophils Relative %: 55.2 % (ref 43.0–77.0)
Platelets: 300 10*3/uL (ref 150.0–400.0)
RBC: 4.77 Mil/uL (ref 4.22–5.81)
RDW: 14.4 % (ref 11.5–15.5)
WBC: 11.1 10*3/uL — ABNORMAL HIGH (ref 4.0–10.5)

## 2023-07-31 LAB — COMPREHENSIVE METABOLIC PANEL
ALT: 19 U/L (ref 0–53)
AST: 13 U/L (ref 0–37)
Albumin: 4 g/dL (ref 3.5–5.2)
Alkaline Phosphatase: 80 U/L (ref 39–117)
BUN: 15 mg/dL (ref 6–23)
CO2: 29 meq/L (ref 19–32)
Calcium: 9.2 mg/dL (ref 8.4–10.5)
Chloride: 99 meq/L (ref 96–112)
Creatinine, Ser: 0.84 mg/dL (ref 0.40–1.50)
GFR: 93.87 mL/min (ref >60.00)
Glucose, Bld: 182 mg/dL — ABNORMAL HIGH (ref 70–99)
Potassium: 3.8 meq/L (ref 3.5–5.1)
Sodium: 140 meq/L (ref 135–145)
Total Bilirubin: 0.8 mg/dL (ref 0.2–1.2)
Total Protein: 7 g/dL (ref 6.0–8.3)

## 2023-07-31 LAB — PSA: PSA: 0.65 ng/mL (ref 0.10–4.00)

## 2023-07-31 LAB — LDL CHOLESTEROL, DIRECT: Direct LDL: 58 mg/dL

## 2023-07-31 LAB — TSH: TSH: 3.49 u[IU]/mL (ref 0.35–5.50)

## 2023-07-31 NOTE — Assessment & Plan Note (Addendum)
B12 IM given today.  Will update B12 lab at follow up.

## 2023-07-31 NOTE — Assessment & Plan Note (Addendum)
Lab Results  Component Value Date   HGBA1C 9.0 (A) 07/30/2023   Poor control.  Increase Ozempic to 0.5 mg weekly. Continue metformin 1000mg  BID, jardiance 25mg .

## 2023-07-31 NOTE — Assessment & Plan Note (Signed)
Declines exam in office today.  Reiterated the importance of having a screening colonoscopy.  He will let me know if he were to change his mind.  I provided him with Anusol ointment.

## 2023-07-31 NOTE — Assessment & Plan Note (Addendum)
On exam irregular irregular heartbeat during auscultation.  Fortunately patient is asymptomatic and EKG shows sinus rhythm.  No previous EKG to compare.  History of CVA . TEE 2018 revealing congenitally malformed bicuspid aortic valve. pending updating echocardiogram, ultrasound carotid, Zio monitor.  Plan to discuss cardiology consult after we have results.

## 2023-07-31 NOTE — Assessment & Plan Note (Addendum)
Suboptimal control.  Increase telmisartan from 40 mg to 60 mg.  Continue Norvasc 10 mg daily, hydrochlorothiazide 12.5 mg.  Labs in 1 week time

## 2023-08-05 ENCOUNTER — Ambulatory Visit: Payer: 59

## 2023-08-07 ENCOUNTER — Other Ambulatory Visit: Payer: Self-pay | Admitting: Family

## 2023-08-07 DIAGNOSIS — K625 Hemorrhage of anus and rectum: Secondary | ICD-10-CM

## 2023-08-07 DIAGNOSIS — I10 Essential (primary) hypertension: Secondary | ICD-10-CM

## 2023-08-07 DIAGNOSIS — R899 Unspecified abnormal finding in specimens from other organs, systems and tissues: Secondary | ICD-10-CM

## 2023-08-07 DIAGNOSIS — R7309 Other abnormal glucose: Secondary | ICD-10-CM

## 2023-08-07 DIAGNOSIS — E538 Deficiency of other specified B group vitamins: Secondary | ICD-10-CM

## 2023-08-07 DIAGNOSIS — I499 Cardiac arrhythmia, unspecified: Secondary | ICD-10-CM

## 2023-08-07 DIAGNOSIS — Z125 Encounter for screening for malignant neoplasm of prostate: Secondary | ICD-10-CM

## 2023-08-07 DIAGNOSIS — I359 Nonrheumatic aortic valve disorder, unspecified: Secondary | ICD-10-CM

## 2023-08-07 DIAGNOSIS — E119 Type 2 diabetes mellitus without complications: Secondary | ICD-10-CM

## 2023-08-07 DIAGNOSIS — Z8673 Personal history of transient ischemic attack (TIA), and cerebral infarction without residual deficits: Secondary | ICD-10-CM

## 2023-08-24 ENCOUNTER — Other Ambulatory Visit: Payer: Self-pay | Admitting: Family

## 2023-08-24 DIAGNOSIS — E119 Type 2 diabetes mellitus without complications: Secondary | ICD-10-CM

## 2023-08-24 DIAGNOSIS — E782 Mixed hyperlipidemia: Secondary | ICD-10-CM

## 2023-08-25 ENCOUNTER — Other Ambulatory Visit: Payer: Self-pay | Admitting: Family

## 2023-08-25 ENCOUNTER — Telehealth: Payer: Self-pay | Admitting: Family

## 2023-08-25 NOTE — Telephone Encounter (Signed)
[facility] called and needs prior authorization for patients Carotid and Ecko from his provider.

## 2023-08-26 ENCOUNTER — Ambulatory Visit: Payer: 59 | Attending: Family

## 2023-08-26 ENCOUNTER — Ambulatory Visit (INDEPENDENT_AMBULATORY_CARE_PROVIDER_SITE_OTHER): Payer: 59

## 2023-08-26 ENCOUNTER — Other Ambulatory Visit: Payer: 59

## 2023-08-26 DIAGNOSIS — I359 Nonrheumatic aortic valve disorder, unspecified: Secondary | ICD-10-CM | POA: Diagnosis not present

## 2023-08-26 DIAGNOSIS — Z8673 Personal history of transient ischemic attack (TIA), and cerebral infarction without residual deficits: Secondary | ICD-10-CM | POA: Diagnosis not present

## 2023-08-26 LAB — ECHOCARDIOGRAM COMPLETE
AR max vel: 1.51 cm2
AV Area VTI: 1.54 cm2
AV Area mean vel: 1.55 cm2
AV Mean grad: 12 mm[Hg]
AV Peak grad: 22.5 mm[Hg]
Ao pk vel: 2.37 m/s
Area-P 1/2: 4.21 cm2
Calc EF: 49 %
P 1/2 time: 374 ms
S' Lateral: 3.8 cm
Single Plane A2C EF: 49.5 %
Single Plane A4C EF: 48.5 %

## 2023-08-26 NOTE — Telephone Encounter (Signed)
Thank you noted.

## 2023-08-27 ENCOUNTER — Other Ambulatory Visit: Payer: Self-pay | Admitting: Family

## 2023-08-27 ENCOUNTER — Telehealth: Payer: Self-pay

## 2023-08-27 DIAGNOSIS — I6523 Occlusion and stenosis of bilateral carotid arteries: Secondary | ICD-10-CM

## 2023-08-27 DIAGNOSIS — I6529 Occlusion and stenosis of unspecified carotid artery: Secondary | ICD-10-CM | POA: Insufficient documentation

## 2023-08-27 NOTE — Telephone Encounter (Signed)
-----   Message from Rennie Plowman sent at 08/27/2023  8:19 AM EST ----- Call patient Please review result note

## 2023-08-27 NOTE — Telephone Encounter (Signed)
Lvm for pt to give office a call back in regards to mychart msg.     [name],   Please confirm if you are still on glipizide. It is discontinued from chart. I had an refill request for glipizide. If you are still taking, please confirm dose, frequency and how long you have been taking   At last visit, you were taking semaglutide 0.5 mg, metformin 1000 mg twice daily, Jardiance 25 mg daily.  Please confirm.   Have you returned Zio monitor ? I do not see resulted in chart.   Echocardiogram report is abnormal.  I am concerned with low normal ejection fraction and hypokinesis.  Evidence of grade 1 diastolic dysfunction or impaired relaxation.  Calcification of aortic valve and mild aortic valve stenosis.   Carotid ultrasound shows 1-39% stenosis.  This can increase risk of stroke.  It is most important to control blood pressure, diabetes, cholesterol.  Please continue daily aspirin 81 mg, Crestor 20 mg.   I have placed a referral to cardiology.  Please call our office if you have not heard from cardiology in the next couple of weeks with appointment scheduled.       Regards, Margaret  Written by Allegra Grana, FNP on 08/27/2023  8:19 AM EST Seen by patient Thomas Brandt on 08/27/2023  8:23 AM

## 2023-08-27 NOTE — Telephone Encounter (Signed)
Pt is not taking the glipizide.

## 2023-09-12 ENCOUNTER — Other Ambulatory Visit: Payer: 59

## 2023-09-19 ENCOUNTER — Other Ambulatory Visit (INDEPENDENT_AMBULATORY_CARE_PROVIDER_SITE_OTHER): Payer: 59

## 2023-09-19 DIAGNOSIS — I10 Essential (primary) hypertension: Secondary | ICD-10-CM | POA: Diagnosis not present

## 2023-09-19 DIAGNOSIS — E538 Deficiency of other specified B group vitamins: Secondary | ICD-10-CM

## 2023-09-19 DIAGNOSIS — E119 Type 2 diabetes mellitus without complications: Secondary | ICD-10-CM

## 2023-09-19 LAB — CBC WITH DIFFERENTIAL/PLATELET
Basophils Absolute: 0 10*3/uL (ref 0.0–0.1)
Basophils Relative: 0.4 % (ref 0.0–3.0)
Eosinophils Absolute: 1.4 10*3/uL — ABNORMAL HIGH (ref 0.0–0.7)
Eosinophils Relative: 13.8 % — ABNORMAL HIGH (ref 0.0–5.0)
HCT: 41.7 % (ref 39.0–52.0)
Hemoglobin: 14 g/dL (ref 13.0–17.0)
Lymphocytes Relative: 24.2 % (ref 12.0–46.0)
Lymphs Abs: 2.5 10*3/uL (ref 0.7–4.0)
MCHC: 33.7 g/dL (ref 30.0–36.0)
MCV: 84.8 fL (ref 78.0–100.0)
Monocytes Absolute: 0.8 10*3/uL (ref 0.1–1.0)
Monocytes Relative: 7.3 % (ref 3.0–12.0)
Neutro Abs: 5.7 10*3/uL (ref 1.4–7.7)
Neutrophils Relative %: 54.3 % (ref 43.0–77.0)
Platelets: 265 10*3/uL (ref 150.0–400.0)
RBC: 4.92 Mil/uL (ref 4.22–5.81)
RDW: 13.8 % (ref 11.5–15.5)
WBC: 10.4 10*3/uL (ref 4.0–10.5)

## 2023-09-19 LAB — BASIC METABOLIC PANEL
BUN: 16 mg/dL (ref 6–23)
CO2: 24 meq/L (ref 19–32)
Calcium: 8.6 mg/dL (ref 8.4–10.5)
Chloride: 100 meq/L (ref 96–112)
Creatinine, Ser: 0.71 mg/dL (ref 0.40–1.50)
GFR: 98.66 mL/min (ref >60.00)
Glucose, Bld: 173 mg/dL — ABNORMAL HIGH (ref 70–99)
Potassium: 3.8 meq/L (ref 3.5–5.1)
Sodium: 137 meq/L (ref 135–145)

## 2023-09-19 LAB — B12 AND FOLATE PANEL
Folate: 10.8 ng/mL (ref >5.9)
Vitamin B-12: 206 pg/mL — ABNORMAL LOW (ref 211–911)

## 2023-10-02 ENCOUNTER — Ambulatory Visit: Payer: 59

## 2023-10-02 ENCOUNTER — Telehealth: Payer: Self-pay | Admitting: Family

## 2023-10-02 DIAGNOSIS — E538 Deficiency of other specified B group vitamins: Secondary | ICD-10-CM

## 2023-10-02 MED ORDER — CYANOCOBALAMIN 1000 MCG/ML IJ SOLN
1000.0000 ug | Freq: Once | INTRAMUSCULAR | Status: AC
  Administered 2023-10-02: 1000 ug via INTRAMUSCULAR

## 2023-10-02 NOTE — Telephone Encounter (Signed)
 Patient came into the office today for his B12 injection. He would like to know if he should keep taking over the counter B12 since he is back on schedule for coming into office for B12.

## 2023-10-02 NOTE — Progress Notes (Addendum)
 Patient presented for B 12 injection to right deltoid, patient voiced no concerns nor showed any signs of distress during injection.

## 2023-10-03 NOTE — Code Documentation (Signed)
 Sent to Rennie Plowman, NP

## 2023-10-03 NOTE — Telephone Encounter (Signed)
 Spoke to pt he stated that he will try the otc b12 and see how that does and we will schedule him in about 2 mnths and recheck his b12 to see if the oral is absorbing

## 2023-10-08 ENCOUNTER — Other Ambulatory Visit: Payer: Self-pay | Admitting: Family

## 2023-10-08 DIAGNOSIS — G6289 Other specified polyneuropathies: Secondary | ICD-10-CM

## 2023-10-08 DIAGNOSIS — E119 Type 2 diabetes mellitus without complications: Secondary | ICD-10-CM

## 2023-10-08 DIAGNOSIS — I10 Essential (primary) hypertension: Secondary | ICD-10-CM

## 2023-10-25 ENCOUNTER — Other Ambulatory Visit: Payer: Self-pay | Admitting: Family

## 2023-10-25 DIAGNOSIS — I10 Essential (primary) hypertension: Secondary | ICD-10-CM

## 2023-11-04 ENCOUNTER — Ambulatory Visit: Payer: 59

## 2023-11-12 ENCOUNTER — Other Ambulatory Visit: Payer: Self-pay | Admitting: Family

## 2023-11-14 ENCOUNTER — Encounter: Payer: Self-pay | Admitting: Cardiology

## 2023-11-14 ENCOUNTER — Ambulatory Visit: Payer: 59 | Attending: Cardiology | Admitting: Cardiology

## 2023-11-14 VITALS — BP 116/60 | HR 93 | Ht 72.0 in | Wt 205.4 lb

## 2023-11-14 DIAGNOSIS — I350 Nonrheumatic aortic (valve) stenosis: Secondary | ICD-10-CM

## 2023-11-14 DIAGNOSIS — Z7289 Other problems related to lifestyle: Secondary | ICD-10-CM

## 2023-11-14 DIAGNOSIS — I10 Essential (primary) hypertension: Secondary | ICD-10-CM | POA: Diagnosis not present

## 2023-11-14 DIAGNOSIS — I6523 Occlusion and stenosis of bilateral carotid arteries: Secondary | ICD-10-CM

## 2023-11-14 DIAGNOSIS — E782 Mixed hyperlipidemia: Secondary | ICD-10-CM

## 2023-11-14 MED ORDER — ROSUVASTATIN CALCIUM 40 MG PO TABS: 40.0000 mg | ORAL_TABLET | Freq: Every day | ORAL | 3 refills | Status: DC

## 2023-11-14 NOTE — Patient Instructions (Signed)
Medication Instructions:  Your physician recommends the following medication changes.  INCREASE: Rosuvastatin 40 daily   *If you need a refill on your cardiac medications before your next appointment, please call your pharmacy*   Lab Work: Your provider would like for you to return in 5 months to have the following labs drawn: Lipid Panel.   Please go to [facility] [address] ([facility] [address], [place] [address] You do not need an appointment.  They are open from 8 am- 4:30 pm.  Lunch from 1:00 pm- 2:00 pm You DO need to be fasting.   You may also go to one of the following [facility]:  [address] [place], [place] [address] Phone: [phone] Lab hours: Mon-Fri 8 am- 5 pm    Lunch 12 pm- 1 pm  [address] [place],  [place]  [address]  [place] Phone: [phone] Lab hours: 7 am- 4 pm Lunch 12 pm-1 pm   [address] [place],  [place]  [address]  [place] Phone: [phone] Lab hours: Mon-Fri 8 am- 5 pm    Lunch 12 pm- 1 pm  If you have labs (blood work) drawn today and your tests are completely normal, you will receive your results only by: MyChart Message (if you have MyChart) OR A paper copy in the mail If you have any lab test that is abnormal or we need to change your treatment, we will call you to review the results.   Follow-Up: At [facility], you and your health needs are our priority.  As part of our continuing mission to provide you with exceptional heart care, we have created designated Provider Care Teams.  These Care Teams include your primary Cardiologist (physician) and Advanced Practice Providers (APPs -  Physician Assistants and Nurse Practitioners) who all work together to provide you with the care you need, when you need it.  We recommend signing up for the patient portal called "MyChart".  Sign up information is provided on this After Visit Summary.  MyChart is used to connect with patients for Virtual Visits  (Telemedicine).  Patients are able to view lab/test results, encounter notes, upcoming appointments, etc.  Non-urgent messages can be sent to your provider as well.   To learn more about what you can do with MyChart, go to [email or web address].    Your next appointment:   6 month(s)  Provider:   You may see Dr Azucena Cecil or one of the following Advanced Practice Providers on your designated Care Team:   Nicolasa Ducking, NP Eula Listen, PA-C Cadence Fransico Jermy, PA-C Charlsie Quest, NP Carlos Levering, NP

## 2023-11-14 NOTE — Progress Notes (Signed)
Cardiology Office Note:    Date:  11/14/2023   ID:  CASTON COOPERSMITH, DOB 03/17/1961, MRN [id number]  PCP:  Allegra Grana, FNP   [facility] Providers Cardiologist:  None     Referring MD: Allegra Grana, FNP   Chief Complaint  Patient presents with   New Patient (Initial Visit)    Referred for cardiac evaluation of bilateral carotid artery stenosis.  History with [facility] cardiology in 2019.  Discuss cardiac testing done in 08/2023.   AZZAM MEHRA is a 63 y.o. male who is being seen today for the evaluation of carotid artery stenosis at the request of Jason Coop Lyn Records, FNP.   History of Present Illness:    MELBERT BOTELHO is a 63 y.o. male with a hx of hypertension, hyperlipidemia, former smoker x 40+ years, currently vapes, CVA in 2019 mild carotid artery stenosis bilaterally presenting to establish care.  He denies chest pain or shortness of breath.  Has been told he has a bicuspid aortic valve.  Echocardiogram was obtained 3 months ago showing normal function, mild aortic valve stenosis.  Cardiac monitor showed no significant arrhythmias.  Has been on Crestor 20 mg daily, for several years now.  Tolerating current BP meds, BP adequately controlled.  Echocardiogram 08/2023 EF 50%, mild aortic valve stenosis, aortic valve not well-visualized. Cardiac monitor 08/2022 no A-fib, no a flutter, 3 nonsustained SVT.  Past Medical History:  Diagnosis Date   Bell's palsy    63 years old   Hyperlipidemia    Hypertension    Stroke (HCC) 2018    History reviewed. No pertinent surgical history.  Current Medications: Current Meds  Medication Sig   Accu-Chek Softclix Lancets lancets Use as instructed   Accu-Chek Softclix Lancets lancets Use as instructed   amLODipine (NORVASC) 10 MG tablet TAKE 1 TABLET BY MOUTH EVERY DAY   aspirin 81 MG chewable tablet Chew by mouth daily.   Blood Glucose Monitoring Suppl (ACCU-CHEK GUIDE) w/Device KIT Use to check blood  sugars   cetirizine (ZYRTEC) 10 MG tablet TAKE 1 TABLET BY MOUTH EVERY DAY   Clotrimazole 1 % OINT Apply 1 application topically 2 (two) times daily.   empagliflozin (JARDIANCE) 25 MG TABS tablet Take 1 tablet (25 mg total) by mouth daily before breakfast.   gabapentin (NEURONTIN) 100 MG capsule TAKE 1 CAPSULE BY MOUTH AT BEDTIME.   glipiZIDE (GLUCOTROL) 5 MG tablet TAKE 1 TABLET (5 MG TOTAL) BY MOUTH TWICE A DAY BEFORE MEALS   hydrochlorothiazide (MICROZIDE) 12.5 MG capsule TAKE 1 CAPSULE BY MOUTH EVERY DAY   hydrocortisone (ANUSOL-HC) 2.5 % rectal cream Place 1 Application rectally 2 (two) times daily.   metFORMIN (GLUCOPHAGE) 1000 MG tablet TAKE 1 TABLET (1,000 MG TOTAL) BY MOUTH TWICE A DAY WITH FOOD   montelukast (SINGULAIR) 10 MG tablet TAKE 1 TABLET BY MOUTH EVERYDAY AT BEDTIME   nystatin-triamcinolone ointment (MYCOLOG) Apply 1 Application topically 2 (two) times daily as needed.   potassium chloride (KLOR-CON) 10 MEQ tablet TAKE 1 TABLET (10 MEQ TOTAL) BY MOUTH EVERY OTHER DAY.   Semaglutide,0.25 or 0.5MG /DOS, (OZEMPIC, 0.25 OR 0.5 MG/DOSE,) 2 MG/3ML SOPN Inject 0.5 mg into the skin once a week.   SYMBICORT 160-4.5 MCG/ACT inhaler TAKE 2 PUFFS BY MOUTH TWICE A DAY   telmisartan (MICARDIS) 20 MG tablet Take 1 tablet (20 mg total) by mouth daily.   telmisartan (MICARDIS) 40 MG tablet Take 1 tablet (40 mg total) by mouth daily.   Tiotropium Bromide  Monohydrate (SPIRIVA RESPIMAT) 2.5 MCG/ACT AERS INHALE 2 PUFFS BY MOUTH INTO THE LUNGS DAILY   [DISCONTINUED] rosuvastatin (CRESTOR) 20 MG tablet TAKE 1 TABLET BY MOUTH EVERY DAY     Allergies:   Semaglutide   Social History   Socioeconomic History   Marital status: Single    Spouse name: Not on file   Number of children: Not on file   Years of education: Not on file   Highest education level: Not on file  Occupational History   Not on file  Tobacco Use   Smoking status: Former    Current packs/day: 0.00    Average packs/day: 0.8  packs/day for 40.2 years (30.1 ttl pk-yrs)    Types: Cigarettes, E-cigarettes    Start date: 02/29/1980    Quit date: 04/30/2020    Years since quitting: 3.5   Smokeless tobacco: Never   Tobacco comments:    3-4 cigarettes a day currently  Vaping Use   Vaping status: Some Days  Substance and Sexual Activity   Alcohol use: Never   Drug use: Never   Sexual activity: Yes  Other Topics Concern   Not on file  Social History Narrative   Married   60 son- 13 year old.    2 grandkids.    Mechanic.    Social Drivers of Corporate investment banker Strain: Not on file  Food Insecurity: No Food Insecurity (01/09/2023)   Hunger Vital Sign    Worried About Running Out of Food in the Last Year: Never true    Ran Out of Food in the Last Year: Never true  Transportation Needs: No Transportation Needs (01/09/2023)   PRAPARE - Administrator, Civil Service (Medical): No    Lack of Transportation (Non-Medical): No  Physical Activity: Not on file  Stress: Not on file  Social Connections: Not on file     Family History: The patient's family history includes Heart disease in his father; Stroke in his father and mother. There is no history of Thyroid cancer.  ROS:   Please see the history of present illness.     All other systems reviewed and are negative.  EKGs/Labs/Other Studies Reviewed:    The following studies were reviewed today:       Recent Labs: 07/30/2023: ALT 19; TSH 3.49 09/19/2023: BUN 16; Creatinine, Ser 0.71; Hemoglobin 14.0; Platelets 265.0; Potassium 3.8; Sodium 137  Recent Lipid Panel    Component Value Date/Time   CHOL 158 07/30/2023 1332   TRIG (H) 07/30/2023 1332    493.0 Triglyceride is over 400; calculations on Lipids are invalid.   HDL 42.10 07/30/2023 1332   CHOLHDL 4 07/30/2023 1332   VLDL 46.0 (H) 08/28/2022 0754   LDLCALC 48 11/16/2018 0926   LDLDIRECT 58.0 07/30/2023 1332     Risk Assessment/Calculations:             Physical Exam:     VS:  BP 116/60 (BP Location: Left Arm, Patient Position: Sitting, Cuff Size: Large)   Pulse 93   Ht 6' (1.829 m)   Wt 205 lb 6.4 oz (93.2 kg)   SpO2 97%   BMI 27.86 kg/m     Wt Readings from Last 3 Encounters:  11/14/23 205 lb 6.4 oz (93.2 kg)  07/30/23 226 lb 3.2 oz (102.6 kg)  04/23/23 213 lb 9.6 oz (96.9 kg)     GEN:  Well nourished, well developed in no acute distress HEENT: Normal NECK: No JVD; No carotid  bruits CARDIAC: RRR, 1/6 systolic murmur RESPIRATORY:  Clear to auscultation without rales, wheezing or rhonchi  ABDOMEN: Soft, non-tender, non-distended MUSCULOSKELETAL:  No edema; No deformity  SKIN: Warm and dry NEUROLOGIC:  Alert and oriented x 3 PSYCHIATRIC:  Normal affect   ASSESSMENT:    1. Bilateral carotid artery stenosis   2. Primary hypertension   3. Mixed hyperlipidemia   4. Engages in vaping   5. Mild aortic valve stenosis    PLAN:    In order of problems listed above:  Bilateral carotid artery stenosis, appears mild on ultrasound.  Continue aspirin, increase Crestor to 40 mg daily.  Refer to vascular surgery for continued monitoring. Hypertension, BP controlled.  Continue Norvasc 10 mg daily, HCTZ 12.5 mg daily, telmisartan 60 mg daily. Hyperlipidemia, triglycerides elevated, LDL at goal.  Increase Crestor to 40 mg daily.  Repeat lipid panel in 5 months. Engages in vaping, cessation advised. Mild aortic valve stenosis, echo 11/24 continue serial monitoring with echoes.  Follow-up in 6 months.       Medication Adjustments/Labs and Tests Ordered: Current medicines are reviewed at length with the patient today.  Concerns regarding medicines are outlined above.  Orders Placed This Encounter  Procedures   Lipid panel   Ambulatory referral to Vascular Surgery   EKG 12-Lead   Meds ordered this encounter  Medications   rosuvastatin (CRESTOR) 40 MG tablet    Sig: Take 1 tablet (40 mg total) by mouth daily.    Dispense:  90 tablet    Refill:   3    Patient Instructions  Medication Instructions:  Your physician recommends the following medication changes.  INCREASE: Rosuvastatin 40 daily   *If you need a refill on your cardiac medications before your next appointment, please call your pharmacy*   Lab Work: Your provider would like for you to return in 5 months to have the following labs drawn: Lipid Panel.   Please go to [facility] [address] ([facility] [address], [place] [address] You do not need an appointment.  They are open from 8 am- 4:30 pm.  Lunch from 1:00 pm- 2:00 pm You DO need to be fasting.   You may also go to one of the following [facility]:  [address] [place], [place] [address] Phone: [phone] Lab hours: Mon-Fri 8 am- 5 pm    Lunch 12 pm- 1 pm  [address] [place],  [place]  [address]  [place] Phone: [phone] Lab hours: 7 am- 4 pm Lunch 12 pm-1 pm   [address] [place],  [place]  [address]  [place] Phone: [phone] Lab hours: Mon-Fri 8 am- 5 pm    Lunch 12 pm- 1 pm  If you have labs (blood work) drawn today and your tests are completely normal, you will receive your results only by: MyChart Message (if you have MyChart) OR A paper copy in the mail If you have any lab test that is abnormal or we need to change your treatment, we will call you to review the results.   Follow-Up: At [facility], you and your health needs are our priority.  As part of our continuing mission to provide you with exceptional heart care, we have created designated Provider Care Teams.  These Care Teams include your primary Cardiologist (physician) and Advanced Practice Providers (APPs -  Physician Assistants and Nurse Practitioners) who all work together to provide you with the care you need, when you need it.  We recommend signing  up for the patient portal called "MyChart".  Sign up information is provided on this After Visit Summary.  MyChart is used to connect with  patients for Virtual Visits (Telemedicine).  Patients are able to view lab/test results, encounter notes, upcoming appointments, etc.  Non-urgent messages can be sent to your provider as well.   To learn more about what you can do with MyChart, go to [email or web address].    Your next appointment:   6 month(s)  Provider:   You may see Dr Azucena Cecil or one of the following Advanced Practice Providers on your designated Care Team:   Nicolasa Ducking, NP Eula Listen, PA-C Cadence Fransico Toshio, PA-C Charlsie Quest, NP Carlos Levering, NP     Signed, Debbe Odea, MD  11/14/2023 4:49 PM    [facility]

## 2023-11-18 ENCOUNTER — Other Ambulatory Visit: Payer: Self-pay | Admitting: Family

## 2023-11-18 DIAGNOSIS — E119 Type 2 diabetes mellitus without complications: Secondary | ICD-10-CM

## 2023-12-25 ENCOUNTER — Other Ambulatory Visit: Payer: Self-pay | Admitting: Family

## 2023-12-29 ENCOUNTER — Other Ambulatory Visit (INDEPENDENT_AMBULATORY_CARE_PROVIDER_SITE_OTHER): Payer: Self-pay | Admitting: Vascular Surgery

## 2023-12-29 DIAGNOSIS — I6523 Occlusion and stenosis of bilateral carotid arteries: Secondary | ICD-10-CM

## 2023-12-30 ENCOUNTER — Ambulatory Visit (INDEPENDENT_AMBULATORY_CARE_PROVIDER_SITE_OTHER): Payer: Self-pay | Admitting: Vascular Surgery

## 2023-12-30 ENCOUNTER — Encounter (INDEPENDENT_AMBULATORY_CARE_PROVIDER_SITE_OTHER): Payer: Self-pay | Admitting: Vascular Surgery

## 2023-12-30 ENCOUNTER — Ambulatory Visit (INDEPENDENT_AMBULATORY_CARE_PROVIDER_SITE_OTHER): Payer: Self-pay

## 2023-12-30 VITALS — BP 131/64 | HR 60 | Resp 16 | Ht 72.0 in | Wt 200.8 lb

## 2023-12-30 DIAGNOSIS — E782 Mixed hyperlipidemia: Secondary | ICD-10-CM

## 2023-12-30 DIAGNOSIS — I10 Essential (primary) hypertension: Secondary | ICD-10-CM | POA: Diagnosis not present

## 2023-12-30 DIAGNOSIS — I6523 Occlusion and stenosis of bilateral carotid arteries: Secondary | ICD-10-CM

## 2023-12-30 DIAGNOSIS — E119 Type 2 diabetes mellitus without complications: Secondary | ICD-10-CM | POA: Diagnosis not present

## 2023-12-30 NOTE — Assessment & Plan Note (Signed)
 lipid control important in reducing the progression of atherosclerotic disease. Continue statin therapy

## 2023-12-30 NOTE — Assessment & Plan Note (Signed)
 blood glucose control important in reducing the progression of atherosclerotic disease. Also, involved in wound healing. On appropriate medications.

## 2023-12-30 NOTE — Progress Notes (Signed)
 Patient ID: Thomas Brandt, male   DOB: [birth year], 63 y.o.   MRN: [id number]  Chief Complaint  Patient presents with   New Patient (Initial Visit)    Ref [facility] consult bilateral carotid artery stenosis    HPI Thomas Brandt is a 63 y.o. male.  I am asked to see the patient by Dr. Azucena Cecil for evaluation of carotid stenosis.  The patient reports a remote history of stroke about 6 years ago.  He has not had further neurologic events since that time.  He denies any recent arm or leg weakness or numbness, speech or swallowing difficulty, or temporary monocular blindness.  Last fall, his cardiologist had a carotid duplex showing 1 to 39% ICA stenosis bilaterally but with velocities at the upper end of that spectrum.  He is on appropriate medical therapy with aspirin and statin agent.  He is referred for an ongoing evaluation and follow-up of his carotid disease.  We performed a carotid duplex today which shows stable findings of bilateral 1 to 39% ICA stenosis with velocities on the upper end of that range.   Past Medical History:  Diagnosis Date   Bell's palsy    63 years old   Hyperlipidemia    Hypertension    Stroke (HCC) 2018    No past surgical history on file.  Family History  Problem Relation Age of Onset   Stroke Mother    Heart disease Father    Stroke Father    Thyroid cancer Neg Hx       Social History   Tobacco Use   Smoking status: Former    Current packs/day: 0.00    Average packs/day: 0.8 packs/day for 40.2 years (30.1 ttl pk-yrs)    Types: Cigarettes, E-cigarettes    Start date: 02/29/1980    Quit date: 04/30/2020    Years since quitting: 3.6   Smokeless tobacco: Never   Tobacco comments:    3-4 cigarettes a day currently  Vaping Use   Vaping status: Some Days  Substance Use Topics   Alcohol use: Never   Drug use: Never     Allergies  Allergen Reactions   Semaglutide Nausea And Vomiting    Current Outpatient Medications  Medication  Sig Dispense Refill   Accu-Chek Softclix Lancets lancets Use as instructed 100 each 12   Accu-Chek Softclix Lancets lancets Use as instructed 100 each 12   amLODipine (NORVASC) 10 MG tablet TAKE 1 TABLET BY MOUTH EVERY DAY 90 tablet 1   aspirin 81 MG chewable tablet Chew by mouth daily.     Blood Glucose Monitoring Suppl (ACCU-CHEK GUIDE) w/Device KIT Use to check blood sugars 1 kit 2   cetirizine (ZYRTEC) 10 MG tablet TAKE 1 TABLET BY MOUTH EVERY DAY 90 tablet 3   Clotrimazole 1 % OINT Apply 1 application topically 2 (two) times daily. 56.7 g 1   empagliflozin (JARDIANCE) 25 MG TABS tablet Take 1 tablet (25 mg total) by mouth daily before breakfast. 90 tablet 3   gabapentin (NEURONTIN) 100 MG capsule TAKE 1 CAPSULE BY MOUTH AT BEDTIME. 90 capsule 3   glipiZIDE (GLUCOTROL) 5 MG tablet TAKE 1 TABLET (5 MG TOTAL) BY MOUTH TWICE A DAY BEFORE MEALS 180 tablet 1   hydrochlorothiazide (MICROZIDE) 12.5 MG capsule TAKE 1 CAPSULE BY MOUTH EVERY DAY 90 capsule 3   hydrocortisone (ANUSOL-HC) 2.5 % rectal cream Place 1 Application rectally 2 (two) times daily. 30 g 1   metFORMIN (GLUCOPHAGE) 1000 MG tablet  TAKE 1 TABLET (1,000 MG TOTAL) BY MOUTH TWICE A DAY WITH FOOD 180 tablet 3   montelukast (SINGULAIR) 10 MG tablet TAKE 1 TABLET BY MOUTH EVERYDAY AT BEDTIME 90 tablet 1   nystatin-triamcinolone ointment (MYCOLOG) Apply 1 Application topically 2 (two) times daily as needed. 30 g 3   potassium chloride (KLOR-CON) 10 MEQ tablet TAKE 1 TABLET (10 MEQ TOTAL) BY MOUTH EVERY OTHER DAY. 45 tablet 2   rosuvastatin (CRESTOR) 40 MG tablet Take 1 tablet (40 mg total) by mouth daily. 90 tablet 3   Semaglutide,0.25 or 0.5MG /DOS, (OZEMPIC, 0.25 OR 0.5 MG/DOSE,) 2 MG/3ML SOPN INJECT 0.5 MG INTO THE SKIN ONE TIME PER WEEK 4 mL 2   SYMBICORT 160-4.5 MCG/ACT inhaler TAKE 2 PUFFS BY MOUTH TWICE A DAY 30.6 each 3   telmisartan (MICARDIS) 20 MG tablet Take 1 tablet (20 mg total) by mouth daily. 90 tablet 3   telmisartan  (MICARDIS) 40 MG tablet Take 1 tablet (40 mg total) by mouth daily. 90 tablet 3   Tiotropium Bromide Monohydrate (SPIRIVA RESPIMAT) 2.5 MCG/ACT AERS INHALE 2 PUFFS BY MOUTH INTO THE LUNGS DAILY 4 g 1   No current facility-administered medications for this visit.   Facility-Administered Medications Ordered in Other Visits  Medication Dose Route Frequency Provider Last Rate Last Admin   albuterol (PROVENTIL) (2.5 MG/3ML) 0.083% nebulizer solution 2.5 mg  2.5 mg Nebulization Once Salena Saner, MD          REVIEW OF SYSTEMS (Negative unless checked)  Constitutional: [] Weight loss  [] Fever  [] Chills Cardiac: [] Chest pain   [] Chest pressure   [] Palpitations   [] Shortness of breath when laying flat   [] Shortness of breath at rest   [] Shortness of breath with exertion. Vascular:  [] Pain in legs with walking   [] Pain in legs at rest   [] Pain in legs when laying flat   [] Claudication   [] Pain in feet when walking  [] Pain in feet at rest  [] Pain in feet when laying flat   [] History of DVT   [] Phlebitis   [] Swelling in legs   [] Varicose veins   [] Non-healing ulcers Pulmonary:   [] Uses home oxygen   [] Productive cough   [] Hemoptysis   [] Wheeze  [] COPD   [] Asthma Neurologic:  [] Dizziness  [] Blackouts   [] Seizures   [x] History of stroke   [] History of TIA  [] Aphasia   [] Temporary blindness   [] Dysphagia   [] Weakness or numbness in arms   [] Weakness or numbness in legs Musculoskeletal:  [] Arthritis   [] Joint swelling   [] Joint pain   [] Low back pain Hematologic:  [] Easy bruising  [] Easy bleeding   [] Hypercoagulable state   [] Anemic  [] Hepatitis Gastrointestinal:  [] Blood in stool   [] Vomiting blood  [] Gastroesophageal reflux/heartburn   [] Abdominal pain Genitourinary:  [] Chronic kidney disease   [] Difficult urination  [] Frequent urination  [] Burning with urination   [] Hematuria Skin:  [] Rashes   [] Ulcers   [] Wounds Psychological:  [] History of anxiety   []  History of major depression.    Physical  Exam BP 131/64   Pulse 60   Resp 16   Ht 6' (1.829 m)   Wt 200 lb 12.8 oz (91.1 kg)   BMI 27.23 kg/m  Gen:  WD/WN, NAD Head: [place]/AT, No temporalis wasting.  Ear/Nose/Throat: Hearing grossly intact, nares w/o erythema or drainage, oropharynx w/o Erythema/Exudate Eyes: Conjunctiva clear, sclera non-icteric  Neck: trachea midline.  No bruit or JVD.  Pulmonary:  Good air movement, clear to auscultation bilaterally.  Cardiac:  RRR, normal S1, S2, no Murmurs, rubs or gallops. Vascular:  Vessel Right Left  Radial Palpable Palpable                   Musculoskeletal: M/S 5/5 throughout.  Extremities without ischemic changes.  No deformity or atrophy. No edema. Neurologic: Sensation grossly intact in extremities.  Symmetrical.  Speech is fluent. Motor exam as listed above. Psychiatric: Judgment intact, Mood & affect appropriate for pt's clinical situation. Dermatologic: No rashes or ulcers noted.  No cellulitis or open wounds. Lymph : No Cervical, Axillary, or Inguinal lymphadenopathy.   Radiology No results found.  Labs No results found for this or any previous visit (from the past 2160 hours).  Assessment/Plan:  Carotid artery stenosis We performed a carotid duplex today which shows stable findings of bilateral 1 to 39% ICA stenosis with velocities on the upper end of that range.  I would continue aspirin and a statin agent.  No role for intervention at this level.  We will continue to follow this with duplex going forward.  Essential hypertension blood pressure control important in reducing the progression of atherosclerotic disease. On appropriate oral medications.   Type 2 diabetes mellitus without complication, without long-term current use of insulin (HCC) blood glucose control important in reducing the progression of atherosclerotic disease. Also, involved in wound healing. On appropriate medications.   Mixed hyperlipidemia lipid control important in reducing the  progression of atherosclerotic disease. Continue statin therapy      Festus Barren 12/30/2023, 11:26 AM   This note was created with Dragon medical transcription system.  Any errors from dictation are unintentional.

## 2023-12-30 NOTE — Assessment & Plan Note (Signed)
 blood pressure control important in reducing the progression of atherosclerotic disease. On appropriate oral medications.

## 2023-12-30 NOTE — Assessment & Plan Note (Signed)
 We performed a carotid duplex today which shows stable findings of bilateral 1 to 39% ICA stenosis with velocities on the upper end of that range.  I would continue aspirin and a statin agent.  No role for intervention at this level.  We will continue to follow this with duplex going forward.

## 2024-01-13 ENCOUNTER — Telehealth: Payer: Self-pay

## 2024-01-13 NOTE — Telephone Encounter (Signed)
 Copied from CRM [id number]. Topic: Clinical - Medication Question >> Jan 13, 2024 11:42 AM Adonis Hoot wrote: Reason for CRM: Patient was prescribed a cream for thrush in his mouth about 6 months ago,he couldn't remember the name of it.He would like to know if provider could prescribe again?He has had the thrush in his month again for about 2 weeks.

## 2024-01-16 ENCOUNTER — Other Ambulatory Visit: Payer: Self-pay | Admitting: Family

## 2024-01-16 DIAGNOSIS — K130 Diseases of lips: Secondary | ICD-10-CM

## 2024-02-13 ENCOUNTER — Other Ambulatory Visit: Payer: Self-pay | Admitting: Family

## 2024-02-13 DIAGNOSIS — E119 Type 2 diabetes mellitus without complications: Secondary | ICD-10-CM

## 2024-02-17 ENCOUNTER — Encounter (INDEPENDENT_AMBULATORY_CARE_PROVIDER_SITE_OTHER): Payer: Self-pay

## 2024-02-28 ENCOUNTER — Other Ambulatory Visit: Payer: Self-pay | Admitting: Family

## 2024-02-28 DIAGNOSIS — E119 Type 2 diabetes mellitus without complications: Secondary | ICD-10-CM

## 2024-03-19 ENCOUNTER — Ambulatory Visit: Admitting: Family

## 2024-03-19 ENCOUNTER — Encounter: Payer: Self-pay | Admitting: Family

## 2024-03-19 ENCOUNTER — Other Ambulatory Visit: Payer: Self-pay | Admitting: Family

## 2024-03-19 VITALS — BP 130/78 | HR 70 | Temp 97.8°F | Ht 72.0 in | Wt 205.4 lb

## 2024-03-19 DIAGNOSIS — I10 Essential (primary) hypertension: Secondary | ICD-10-CM

## 2024-03-19 DIAGNOSIS — R7309 Other abnormal glucose: Secondary | ICD-10-CM

## 2024-03-19 DIAGNOSIS — E119 Type 2 diabetes mellitus without complications: Secondary | ICD-10-CM

## 2024-03-19 LAB — POCT GLYCOSYLATED HEMOGLOBIN (HGB A1C): Hemoglobin A1C: 7 % — AB (ref 4.0–5.6)

## 2024-03-19 NOTE — Assessment & Plan Note (Signed)
 Chronic, stable Continue Norvasc  10 mg daily, HCTZ 12.5 mg daily, telmisartan  60 mg daily

## 2024-03-19 NOTE — Progress Notes (Signed)
 Assessment & Plan:  Essential hypertension Assessment & Plan: Chronic, stable Continue Norvasc  10 mg daily, HCTZ 12.5 mg daily, telmisartan  60 mg daily   Orders: -     Comprehensive metabolic panel with GFR; Future  Elevated glucose -     POCT glycosylated hemoglobin (Hb A1C)  Type 2 diabetes mellitus without complication, without long-term current use of insulin (HCC) Assessment & Plan: Improved. He stopped jardiance  on his own; discussed renal and ASCVD protection on medication. No h/o urine microalbuminuria.  Will follow Continue ozempic  0.5mg  , metformin  1000mg  BID, glipizide  5mg  BID  He declines DM foot exam.       Return precautions given.   Risks, benefits, and alternatives of the medications and treatment plan prescribed today were discussed, and patient expressed understanding.   Education regarding symptom management and diagnosis given to patient on AVS either electronically or printed.  No follow-ups on file.  Bascom Bossier, FNP  Subjective:    Patient ID: Thomas Brandt, male    DOB: Apr 26, 1961, 63 y.o.   MRN: [id number]  CC: ERIKA HUSSAR is a 63 y.o. male who presents today for follow up.   HPI: Feels well today He has stopped drinking Pepsi, and limiting sugar as well.   He has stopped jardiance  on his own.     Compliant with ozempic  0.5mg  , metformin  1000mg  BID, glipizide  5mg  BID  Compliant with Norvasc  10 mg daily, HCTZ 12.5 mg daily, telmisartan  60 mg daily   Last seen by Dr Debra Familia, 11/14/23 Increased crestor  to 40mg  , and referred to vascular for BL carotid stenosis.    He declined screening for lung cancer screening, lung cancer, Shingrix vaccine  Allergies: Semaglutide  Current Outpatient Medications on File Prior to Visit  Medication Sig Dispense Refill   Accu-Chek Softclix Lancets lancets Use as instructed 100 each 12   Accu-Chek Softclix Lancets lancets Use as instructed 100 each 12   amLODipine  (NORVASC ) 10 MG tablet TAKE 1  TABLET BY MOUTH EVERY DAY 90 tablet 1   aspirin 81 MG chewable tablet Chew by mouth daily.     Blood Glucose Monitoring Suppl (ACCU-CHEK GUIDE) w/Device KIT Use to check blood sugars 1 kit 2   cetirizine  (ZYRTEC ) 10 MG tablet TAKE 1 TABLET BY MOUTH EVERY DAY 90 tablet 3   Clotrimazole  1 % OINT Apply 1 application topically 2 (two) times daily. 56.7 g 1   gabapentin  (NEURONTIN ) 100 MG capsule TAKE 1 CAPSULE BY MOUTH AT BEDTIME. 90 capsule 3   glipiZIDE  (GLUCOTROL ) 5 MG tablet TAKE 1 TABLET (5 MG TOTAL) BY MOUTH TWICE A DAY BEFORE MEALS 180 tablet 1   hydrochlorothiazide  (MICROZIDE ) 12.5 MG capsule TAKE 1 CAPSULE BY MOUTH EVERY DAY 90 capsule 3   hydrocortisone  (ANUSOL -HC) 2.5 % rectal cream Place 1 Application rectally 2 (two) times daily. 30 g 1   metFORMIN  (GLUCOPHAGE ) 1000 MG tablet TAKE 1 TABLET (1,000 MG TOTAL) BY MOUTH TWICE A DAY WITH FOOD 60 tablet 0   montelukast  (SINGULAIR ) 10 MG tablet TAKE 1 TABLET BY MOUTH EVERYDAY AT BEDTIME 90 tablet 1   nystatin -triamcinolone  ointment (MYCOLOG) APPLY 1 APPLICATION TOPICALLY TWICE A DAY AS NEEDED 30 g 3   potassium chloride  (KLOR-CON ) 10 MEQ tablet TAKE 1 TABLET (10 MEQ TOTAL) BY MOUTH EVERY OTHER DAY. 45 tablet 2   rosuvastatin  (CRESTOR ) 40 MG tablet Take 1 tablet (40 mg total) by mouth daily. 90 tablet 3   Semaglutide ,0.25 or 0.5MG /DOS, (OZEMPIC , 0.25 OR 0.5 MG/DOSE,) 2 MG/3ML SOPN  INJECT 0.5 MG INTO THE SKIN ONE TIME PER WEEK 4 mL 2   SYMBICORT  160-4.5 MCG/ACT inhaler TAKE 2 PUFFS BY MOUTH TWICE A DAY 30.6 each 3   telmisartan  (MICARDIS ) 20 MG tablet Take 1 tablet (20 mg total) by mouth daily. 90 tablet 3   telmisartan  (MICARDIS ) 40 MG tablet Take 1 tablet (40 mg total) by mouth daily. 90 tablet 3   Tiotropium Bromide Monohydrate  (SPIRIVA  RESPIMAT) 2.5 MCG/ACT AERS INHALE 2 PUFFS BY MOUTH INTO THE LUNGS DAILY 4 g 1   Current Facility-Administered Medications on File Prior to Visit  Medication Dose Route Frequency Provider Last Rate Last Admin    albuterol  (PROVENTIL ) (2.5 MG/3ML) 0.083% nebulizer solution 2.5 mg  2.5 mg Nebulization Once Marc Senior, MD        Review of Systems  Constitutional:  Negative for chills and fever.  Respiratory:  Negative for cough.   Cardiovascular:  Negative for chest pain and palpitations.  Gastrointestinal:  Negative for nausea and vomiting.      Objective:    BP 130/78   Pulse 70   Temp 97.8 F (36.6 C) (Oral)   Ht 6' (1.829 m)   Wt 205 lb 6.4 oz (93.2 kg)   SpO2 95%   BMI 27.86 kg/m  BP Readings from Last 3 Encounters:  03/19/24 130/78  12/30/23 131/64  11/14/23 116/60   Wt Readings from Last 3 Encounters:  03/19/24 205 lb 6.4 oz (93.2 kg)  12/30/23 200 lb 12.8 oz (91.1 kg)  11/14/23 205 lb 6.4 oz (93.2 kg)    Physical Exam Vitals reviewed.  Constitutional:      Appearance: He is well-developed.   Cardiovascular:     Rate and Rhythm: Regular rhythm.     Heart sounds: Normal heart sounds.  Pulmonary:     Effort: Pulmonary effort is normal. No respiratory distress.     Breath sounds: Normal breath sounds. No wheezing, rhonchi or rales.   Skin:    General: Skin is warm and dry.   Neurological:     Mental Status: He is alert.   Psychiatric:        Speech: Speech normal.        Behavior: Behavior normal.

## 2024-03-19 NOTE — Assessment & Plan Note (Signed)
 Improved. He stopped jardiance  on his own; discussed renal and ASCVD protection on medication. No h/o urine microalbuminuria.  Will follow Continue ozempic  0.5mg  , metformin  1000mg  BID, glipizide  5mg  BID  He declines DM foot exam.

## 2024-03-24 ENCOUNTER — Other Ambulatory Visit: Payer: Self-pay | Admitting: Family

## 2024-03-24 DIAGNOSIS — E119 Type 2 diabetes mellitus without complications: Secondary | ICD-10-CM

## 2024-04-03 ENCOUNTER — Other Ambulatory Visit: Payer: Self-pay | Admitting: Family

## 2024-04-03 DIAGNOSIS — E119 Type 2 diabetes mellitus without complications: Secondary | ICD-10-CM

## 2024-04-15 ENCOUNTER — Other Ambulatory Visit: Payer: Self-pay | Admitting: Family

## 2024-05-05 ENCOUNTER — Other Ambulatory Visit: Payer: Self-pay | Admitting: Family

## 2024-05-05 DIAGNOSIS — I10 Essential (primary) hypertension: Secondary | ICD-10-CM

## 2024-05-10 ENCOUNTER — Other Ambulatory Visit: Payer: Self-pay

## 2024-05-10 DIAGNOSIS — Z79899 Other long term (current) drug therapy: Secondary | ICD-10-CM

## 2024-05-11 ENCOUNTER — Ambulatory Visit: Payer: Self-pay | Admitting: Cardiology

## 2024-05-11 LAB — LIPID PANEL
Chol/HDL Ratio: 2.8 ratio (ref 0.0–5.0)
Cholesterol, Total: 120 mg/dL (ref 100–199)
HDL: 43 mg/dL (ref >39)
LDL Chol Calc (NIH): 50 mg/dL (ref 0–99)
Triglycerides: 162 mg/dL — ABNORMAL HIGH (ref 0–149)
VLDL Cholesterol Cal: 27 mg/dL (ref 5–40)

## 2024-05-14 ENCOUNTER — Encounter: Payer: Self-pay | Admitting: Cardiology

## 2024-05-14 ENCOUNTER — Ambulatory Visit: Attending: Cardiology | Admitting: Cardiology

## 2024-05-14 VITALS — BP 130/50 | HR 91 | Ht 72.0 in | Wt 214.4 lb

## 2024-05-14 DIAGNOSIS — Z7289 Other problems related to lifestyle: Secondary | ICD-10-CM

## 2024-05-14 DIAGNOSIS — E782 Mixed hyperlipidemia: Secondary | ICD-10-CM

## 2024-05-14 DIAGNOSIS — I10 Essential (primary) hypertension: Secondary | ICD-10-CM | POA: Diagnosis not present

## 2024-05-14 DIAGNOSIS — I350 Nonrheumatic aortic (valve) stenosis: Secondary | ICD-10-CM | POA: Diagnosis not present

## 2024-05-14 NOTE — Patient Instructions (Signed)
 Medication Instructions:  Your physician recommends that you continue on your current medications as directed. Please refer to the Current Medication list given to you today.   *If you need a refill on your cardiac medications before your next appointment, please call your pharmacy*  Lab Work: No labs ordered today  If you have labs (blood work) drawn today and your tests are completely normal, you will receive your results only by: MyChart Message (if you have MyChart) OR A paper copy in the mail If you have any lab test that is abnormal or we need to change your treatment, we will call you to review the results.  Testing/Procedures: No test ordered today   Follow-Up: At [facility], you and your health needs are our priority.  As part of our continuing mission to provide you with exceptional heart care, our providers are all part of one team.  This team includes your primary Cardiologist (physician) and Advanced Practice Providers or APPs (Physician Assistants and Nurse Practitioners) who all work together to provide you with the care you need, when you need it.  Your next appointment:   1 year(s)  Provider:   You may see Dr. Darliss or one of the following Advanced Practice Providers on your designated Care Team:   Lonni Meager, NP Lesley Maffucci, PA-C Bernardino Bring, PA-C Cadence [place], PA-C Tylene Lunch, NP Barnie Hila, NP    We recommend signing up for the patient portal called MyChart.  Sign up information is provided on this After Visit Summary.  MyChart is used to connect with patients for Virtual Visits (Telemedicine).  Patients are able to view lab/test results, encounter notes, upcoming appointments, etc.  Non-urgent messages can be sent to your provider as well.   To learn more about what you can do with MyChart, go to [email or web address].

## 2024-05-14 NOTE — Progress Notes (Signed)
 Cardiology Office Note:    Date:  05/14/2024   ID:  Thomas Brandt, DOB [birth year], MRN [id number]  PCP:  Dineen Rollene MATSU, FNP   [facility] Providers Cardiologist:  None     Referring MD: Dineen Rollene MATSU, FNP   Chief Complaint  Patient presents with   Follow-up    Pt doing good.    History of Present Illness:    Thomas Brandt is a 63 y.o. male with a hx of hypertension, hyperlipidemia, former smoker x 40+ years, currently vapes, CVA in 2019 mild carotid artery stenosis bilaterally presenting for follow-up.  Overall doing okay, Crestor previously increased to 40 mg daily.  Denies chest pain or shortness of breath.  BP at home well-controlled.  Overall feels well, no concerns today.  Prior notes/testing Echocardiogram 08/2023 EF 50%, mild aortic valve stenosis, aortic valve not well-visualized. Cardiac monitor 08/2022 no A-fib, no a flutter, 3 nonsustained SVT.  Past Medical History:  Diagnosis Date   Bell's palsy    63 years old   Hyperlipidemia    Hypertension    Stroke (HCC) 2018    History reviewed. No pertinent surgical history.  Current Medications: Current Meds  Medication Sig   Accu-Chek Softclix Lancets lancets Use as instructed   Accu-Chek Softclix Lancets lancets Use as instructed   amLODipine (NORVASC) 10 MG tablet TAKE 1 TABLET BY MOUTH EVERY DAY   aspirin 81 MG chewable tablet Chew by mouth daily.   Blood Glucose Monitoring Suppl (ACCU-CHEK GUIDE) w/Device KIT Use to check blood sugars   cetirizine (ZYRTEC) 10 MG tablet TAKE 1 TABLET BY MOUTH EVERY DAY   Clotrimazole 1 % OINT Apply 1 application topically 2 (two) times daily.   gabapentin (NEURONTIN) 100 MG capsule TAKE 1 CAPSULE BY MOUTH AT BEDTIME.   glipiZIDE (GLUCOTROL) 5 MG tablet TAKE 1 TABLET (5 MG TOTAL) BY MOUTH TWICE A DAY BEFORE MEALS   hydrochlorothiazide (MICROZIDE) 12.5 MG capsule TAKE 1 CAPSULE BY MOUTH EVERY DAY   hydrocortisone (ANUSOL-HC) 2.5 % rectal cream Place  1 Application rectally 2 (two) times daily.   metFORMIN (GLUCOPHAGE) 1000 MG tablet TAKE 1 TABLET (1,000 MG TOTAL) BY MOUTH TWICE A DAY WITH FOOD   montelukast (SINGULAIR) 10 MG tablet TAKE 1 TABLET BY MOUTH EVERYDAY AT BEDTIME   nystatin-triamcinolone ointment (MYCOLOG) APPLY 1 APPLICATION TOPICALLY TWICE A DAY AS NEEDED   potassium chloride (KLOR-CON) 10 MEQ tablet TAKE 1 TABLET (10 MEQ TOTAL) BY MOUTH EVERY OTHER DAY.   rosuvastatin (CRESTOR) 40 MG tablet Take 1 tablet (40 mg total) by mouth daily.   Semaglutide,0.25 or 0.5MG /DOS, (OZEMPIC, 0.25 OR 0.5 MG/DOSE,) 2 MG/3ML SOPN INJECT 0.5 MG INTO THE SKIN ONE TIME PER WEEK   SYMBICORT 160-4.5 MCG/ACT inhaler TAKE 2 PUFFS BY MOUTH TWICE A DAY   telmisartan (MICARDIS) 20 MG tablet Take 1 tablet (20 mg total) by mouth daily.   telmisartan (MICARDIS) 40 MG tablet Take 1 tablet (40 mg total) by mouth daily.   Tiotropium Bromide Monohydrate (SPIRIVA RESPIMAT) 2.5 MCG/ACT AERS INHALE 2 PUFFS BY MOUTH INTO THE LUNGS DAILY     Allergies:   Semaglutide   Social History   Socioeconomic History   Marital status: Married    Spouse name: Not on file   Number of children: Not on file   Years of education: Not on file   Highest education level: 12th grade  Occupational History   Not on file  Tobacco Use   Smoking status: Former  Current packs/day: 0.00    Average packs/day: 0.8 packs/day for 40.2 years (30.1 ttl pk-yrs)    Types: Cigarettes, E-cigarettes    Start date: 02/29/1980    Quit date: 04/30/2020    Years since quitting: 4.0   Smokeless tobacco: Never   Tobacco comments:    3-4 cigarettes a day currently  Vaping Use   Vaping status: Some Days  Substance and Sexual Activity   Alcohol use: Never   Drug use: Never   Sexual activity: Yes  Other Topics Concern   Not on file  Social History Narrative   Married   6 son- 74 year old.    2 grandkids.    Mechanic.    Social Drivers of Corporate investment banker Strain: Low Risk   (03/18/2024)   Overall Financial Resource Strain (CARDIA)    Difficulty of Paying Living Expenses: Not hard at all  Food Insecurity: No Food Insecurity (03/18/2024)   Hunger Vital Sign    Worried About Running Out of Food in the Last Year: Never true    Ran Out of Food in the Last Year: Never true  Transportation Needs: No Transportation Needs (03/18/2024)   PRAPARE - Administrator, Civil Service (Medical): No    Lack of Transportation (Non-Medical): No  Physical Activity: Insufficiently Active (03/18/2024)   Exercise Vital Sign    Days of Exercise per Week: 7 days    Minutes of Exercise per Session: 10 min  Stress: No Stress Concern Present (03/18/2024)   [employer] of Occupational Health - Occupational Stress Questionnaire    Feeling of Stress: Not at all  Social Connections: Moderately Integrated (03/18/2024)   Social Connection and Isolation Panel    Frequency of Communication with Friends and Family: Once a week    Frequency of Social Gatherings with Friends and Family: Twice a week    Attends Religious Services: 1 to 4 times per year    Active Member of [employer] or Organizations: No    Attends Engineer, structural: Not on file    Marital Status: Married     Family History: The patient's family history includes Heart disease in his father; Stroke in his father and mother. There is no history of Thyroid cancer.  ROS:   Please see the history of present illness.     All other systems reviewed and are negative.  EKGs/Labs/Other Studies Reviewed:    The following studies were reviewed today:  EKG Interpretation Date/Time:  Friday May 14 2024 10:06:02 EDT Ventricular Rate:  91 PR Interval:  162 QRS Duration:  100 QT Interval:  366 QTC Calculation: 450 R Axis:   7  Text Interpretation: Sinus rhythm with frequent Premature ventricular complexes Possible Left atrial enlargement Confirmed by Darliss Rogue ([id number]) on 05/14/2024 10:19:43 AM     Recent Labs: 07/30/2023: ALT 19; TSH 3.49 09/19/2023: BUN 16; Creatinine, Ser 0.71; Hemoglobin 14.0; Platelets 265.0; Potassium 3.8; Sodium 137  Recent Lipid Panel    Component Value Date/Time   CHOL 120 05/10/2024 0842   TRIG 162 (H) 05/10/2024 0842   HDL 43 05/10/2024 0842   CHOLHDL 2.8 05/10/2024 0842   CHOLHDL 4 07/30/2023 1332   VLDL 46.0 (H) 08/28/2022 0754   LDLCALC 50 05/10/2024 0842   LDLDIRECT 58.0 07/30/2023 1332     Risk Assessment/Calculations:             Physical Exam:    VS:  BP (!) 130/50 (BP Location: Left Arm, Patient  Position: Sitting, Cuff Size: Normal)   Pulse 91   Ht 6' (1.829 m)   Wt 214 lb 6.4 oz (97.3 kg)   SpO2 98%   BMI 29.08 kg/m     Wt Readings from Last 3 Encounters:  05/14/24 214 lb 6.4 oz (97.3 kg)  03/19/24 205 lb 6.4 oz (93.2 kg)  12/30/23 200 lb 12.8 oz (91.1 kg)     GEN:  Well nourished, well developed in no acute distress HEENT: Normal NECK: No JVD; No carotid bruits CARDIAC: RRR, 1/6 systolic murmur RESPIRATORY:  Clear to auscultation without rales, wheezing or rhonchi  ABDOMEN: Soft, non-tender, non-distended MUSCULOSKELETAL:  No edema; No deformity  SKIN: Warm and dry NEUROLOGIC:  Alert and oriented x 3 PSYCHIATRIC:  Normal affect   ASSESSMENT:    1. Primary hypertension   2. Mixed hyperlipidemia   3. Engages in vaping   4. Mild aortic valve stenosis    PLAN:    In order of problems listed above:  Hypertension, BP controlled.  Continue Norvasc 10 mg daily, HCTZ 12.5 mg daily, telmisartan 60 mg daily. Hyperlipidemia, triglycerides elevated but improving, LDL at goal.  Continue Crestor 40 mg daily.   Engages in vaping, cessation advised. Mild aortic valve stenosis, echo 11/24 continue serial monitoring with echoes.  Follow-up in 12 months.     Medication Adjustments/Labs and Tests Ordered: Current medicines are reviewed at length with the patient today.  Concerns regarding medicines are outlined above.   Orders Placed This Encounter  Procedures   EKG 12-Lead   No orders of the defined types were placed in this encounter.   Patient Instructions  Medication Instructions:  Your physician recommends that you continue on your current medications as directed. Please refer to the Current Medication list given to you today.   *If you need a refill on your cardiac medications before your next appointment, please call your pharmacy*  Lab Work: No labs ordered today  If you have labs (blood work) drawn today and your tests are completely normal, you will receive your results only by: MyChart Message (if you have MyChart) OR A paper copy in the mail If you have any lab test that is abnormal or we need to change your treatment, we will call you to review the results.  Testing/Procedures: No test ordered today   Follow-Up: At [facility], you and your health needs are our priority.  As part of our continuing mission to provide you with exceptional heart care, our providers are all part of one team.  This team includes your primary Cardiologist (physician) and Advanced Practice Providers or APPs (Physician Assistants and Nurse Practitioners) who all work together to provide you with the care you need, when you need it.  Your next appointment:   1 year(s)  Provider:   You may see Dr. Darliss or one of the following Advanced Practice Providers on your designated Care Team:   Lonni Meager, NP Lesley Maffucci, PA-C Bernardino Bring, PA-C Cadence [place], PA-C Tylene Lunch, NP Barnie Hila, NP    We recommend signing up for the patient portal called MyChart.  Sign up information is provided on this After Visit Summary.  MyChart is used to connect with patients for Virtual Visits (Telemedicine).  Patients are able to view lab/test results, encounter notes, upcoming appointments, etc.  Non-urgent messages can be sent to your provider as well.   To learn more about what you can do  with MyChart, go to [email or web address].  Signed, Redell Cave, MD  05/14/2024 11:13 AM    [facility]

## 2024-05-18 ENCOUNTER — Other Ambulatory Visit: Payer: Self-pay | Admitting: Family

## 2024-05-18 DIAGNOSIS — E119 Type 2 diabetes mellitus without complications: Secondary | ICD-10-CM

## 2024-06-01 ENCOUNTER — Telehealth: Payer: Self-pay | Admitting: Family

## 2024-06-01 MED ORDER — ACCU-CHEK GUIDE TEST VI STRP: ORAL_STRIP | 12 refills | Status: AC

## 2024-06-01 NOTE — Telephone Encounter (Signed)
 Copied from CRM [id number]. Topic: Clinical - Medication Refill >> Jun 01, 2024  1:33 PM Mercedes MATSU wrote: Medication: Accu-Chek Test Strips  Has the patient contacted their pharmacy? Yes (Agent: If no, request that the patient contact the pharmacy for the refill. If patient does not wish to contact the pharmacy document the reason why and proceed with request.) (Agent: If yes, when and what did the pharmacy advise?)  This is the patient's preferred pharmacy:  [facility] [address] [place] - [address] [place] [address] Phone: [phone] Fax: [phone]  Is this the correct pharmacy for this prescription? Yes If no, delete pharmacy and type the correct one.   Has the prescription been filled recently? Yes  Is the patient out of the medication? Yes  Has the patient been seen for an appointment in the last year OR does the patient have an upcoming appointment? Yes  Can we respond through MyChart? Yes  Agent: Please be advised that Rx refills may take up to 3 business days. We ask that you follow-up with your pharmacy.

## 2024-06-01 NOTE — Telephone Encounter (Signed)
 Test strips sent in pt has been notified

## 2024-07-18 ENCOUNTER — Other Ambulatory Visit: Payer: Self-pay | Admitting: Family

## 2024-07-18 DIAGNOSIS — I10 Essential (primary) hypertension: Secondary | ICD-10-CM

## 2024-08-28 ENCOUNTER — Other Ambulatory Visit: Payer: Self-pay | Admitting: Family

## 2024-08-28 DIAGNOSIS — I10 Essential (primary) hypertension: Secondary | ICD-10-CM

## 2024-09-27 ENCOUNTER — Telehealth: Payer: Self-pay | Admitting: Family

## 2024-09-27 NOTE — Telephone Encounter (Signed)
 Due for DM f/u  Please schedule

## 2024-09-27 NOTE — Telephone Encounter (Signed)
 Called Patient to schedule diabetes follow up per Rollene Northern and he states that he will have to check his work calendar and call us  back to schedule this diabetes follow up.

## 2024-10-16 ENCOUNTER — Other Ambulatory Visit: Payer: Self-pay | Admitting: Family

## 2024-10-16 ENCOUNTER — Other Ambulatory Visit: Payer: Self-pay | Admitting: Cardiology

## 2024-10-16 DIAGNOSIS — E782 Mixed hyperlipidemia: Secondary | ICD-10-CM

## 2024-10-16 DIAGNOSIS — I10 Essential (primary) hypertension: Secondary | ICD-10-CM

## 2024-10-16 DIAGNOSIS — G6289 Other specified polyneuropathies: Secondary | ICD-10-CM

## 2024-10-22 ENCOUNTER — Telehealth: Payer: Self-pay

## 2024-10-22 MED ORDER — BUDESONIDE-FORMOTEROL FUMARATE 160-4.5 MCG/ACT IN AERO: INHALATION_SPRAY | RESPIRATORY_TRACT | 0 refills | Status: AC

## 2024-10-22 NOTE — Telephone Encounter (Signed)
 Scheduled appt for DM f/up

## 2024-10-22 NOTE — Telephone Encounter (Signed)
 Refilled symbicort  Overdue DM appt  Please sch

## 2024-10-22 NOTE — Addendum Note (Signed)
 Addended by: DINEEN ROLLENE MATSU on: 10/22/2024 11:11 AM   Modules accepted: Orders

## 2024-10-22 NOTE — Telephone Encounter (Signed)
 Copied from CRM #[id number]. Topic: Clinical - Medication Refill >> Oct 21, 2024 11:13 AM Macario HERO wrote: Medication: SYMBICORT  160-4.5 MCG/ACT inhaler [[id number]]  Has the patient contacted their pharmacy? Yes (Agent: If no, request that the patient contact the pharmacy for the refill. If patient does not wish to contact the pharmacy document the reason why and proceed with request.) (Agent: If yes, when and what did the pharmacy advise?)  This is the patient's preferred pharmacy:  [facility] [address] [place] - [address] [place] [address] Phone: [phone] Fax: [phone]  Is this the correct pharmacy for this prescription? Yes If no, delete pharmacy and type the correct one.   Has the prescription been filled recently? Yes  Is the patient out of the medication? No  Has the patient been seen for an appointment in the last year OR does the patient have an upcoming appointment? Yes  Can we respond through MyChart? Yes  Agent: Please be advised that Rx refills may take up to 3 business days. We ask that you follow-up with your pharmacy.

## 2024-10-23 ENCOUNTER — Other Ambulatory Visit: Payer: Self-pay | Admitting: Family

## 2024-10-23 DIAGNOSIS — E119 Type 2 diabetes mellitus without complications: Secondary | ICD-10-CM

## 2024-11-29 ENCOUNTER — Ambulatory Visit: Admitting: Family

## 2024-12-31 ENCOUNTER — Encounter (INDEPENDENT_AMBULATORY_CARE_PROVIDER_SITE_OTHER)

## 2024-12-31 ENCOUNTER — Ambulatory Visit (INDEPENDENT_AMBULATORY_CARE_PROVIDER_SITE_OTHER): Admitting: Vascular Surgery
